# Patient Record
Sex: Male | Born: 1945 | Race: White | Hispanic: No | Marital: Married | Smoking: Former smoker
Health system: Southern US, Community
[De-identification: ages and names within clinical notes are randomized; demographics above are authoritative.]

## PROBLEM LIST (undated history)

## (undated) HISTORY — PX: TONSILLECTOMY: SUR1361

## (undated) HISTORY — PX: ADENOIDECTOMY: SUR15

---

## 2011-09-14 ENCOUNTER — Observation Stay: Payer: Self-pay | Admitting: Internal Medicine

## 2015-11-19 DIAGNOSIS — M436 Torticollis: Secondary | ICD-10-CM | POA: Diagnosis not present

## 2015-12-02 DIAGNOSIS — L570 Actinic keratosis: Secondary | ICD-10-CM | POA: Diagnosis not present

## 2015-12-02 DIAGNOSIS — L578 Other skin changes due to chronic exposure to nonionizing radiation: Secondary | ICD-10-CM | POA: Diagnosis not present

## 2016-01-21 DIAGNOSIS — E291 Testicular hypofunction: Secondary | ICD-10-CM | POA: Diagnosis not present

## 2016-01-21 DIAGNOSIS — Z0000 Encounter for general adult medical examination without abnormal findings: Secondary | ICD-10-CM | POA: Diagnosis not present

## 2016-01-21 DIAGNOSIS — Z1211 Encounter for screening for malignant neoplasm of colon: Secondary | ICD-10-CM | POA: Diagnosis not present

## 2016-01-21 DIAGNOSIS — Z125 Encounter for screening for malignant neoplasm of prostate: Secondary | ICD-10-CM | POA: Diagnosis not present

## 2016-06-07 DIAGNOSIS — M1711 Unilateral primary osteoarthritis, right knee: Secondary | ICD-10-CM | POA: Diagnosis not present

## 2016-07-15 DIAGNOSIS — M1711 Unilateral primary osteoarthritis, right knee: Secondary | ICD-10-CM | POA: Diagnosis not present

## 2016-11-07 DIAGNOSIS — R918 Other nonspecific abnormal finding of lung field: Secondary | ICD-10-CM | POA: Diagnosis not present

## 2016-11-07 DIAGNOSIS — R0789 Other chest pain: Secondary | ICD-10-CM | POA: Diagnosis not present

## 2016-11-07 DIAGNOSIS — R0781 Pleurodynia: Secondary | ICD-10-CM | POA: Diagnosis not present

## 2017-01-24 DIAGNOSIS — E349 Endocrine disorder, unspecified: Secondary | ICD-10-CM | POA: Diagnosis not present

## 2017-01-24 DIAGNOSIS — Z1211 Encounter for screening for malignant neoplasm of colon: Secondary | ICD-10-CM | POA: Diagnosis not present

## 2017-01-24 DIAGNOSIS — Z0000 Encounter for general adult medical examination without abnormal findings: Secondary | ICD-10-CM | POA: Diagnosis not present

## 2017-01-24 DIAGNOSIS — J111 Influenza due to unidentified influenza virus with other respiratory manifestations: Secondary | ICD-10-CM | POA: Diagnosis not present

## 2017-01-24 DIAGNOSIS — Z125 Encounter for screening for malignant neoplasm of prostate: Secondary | ICD-10-CM | POA: Diagnosis not present

## 2017-01-24 DIAGNOSIS — R300 Dysuria: Secondary | ICD-10-CM | POA: Diagnosis not present

## 2017-01-24 DIAGNOSIS — J61 Pneumoconiosis due to asbestos and other mineral fibers: Secondary | ICD-10-CM | POA: Diagnosis not present

## 2017-01-24 DIAGNOSIS — Z77090 Contact with and (suspected) exposure to asbestos: Secondary | ICD-10-CM | POA: Diagnosis not present

## 2017-03-04 ENCOUNTER — Ambulatory Visit: Payer: Self-pay | Admitting: Psychology

## 2017-03-10 DIAGNOSIS — Z1211 Encounter for screening for malignant neoplasm of colon: Secondary | ICD-10-CM | POA: Diagnosis not present

## 2017-03-10 DIAGNOSIS — E349 Endocrine disorder, unspecified: Secondary | ICD-10-CM | POA: Diagnosis not present

## 2017-03-28 ENCOUNTER — Ambulatory Visit: Payer: Self-pay | Admitting: Psychology

## 2017-03-28 DIAGNOSIS — E349 Endocrine disorder, unspecified: Secondary | ICD-10-CM | POA: Diagnosis not present

## 2017-04-21 DIAGNOSIS — E349 Endocrine disorder, unspecified: Secondary | ICD-10-CM | POA: Diagnosis not present

## 2017-04-26 DIAGNOSIS — R234 Changes in skin texture: Secondary | ICD-10-CM | POA: Diagnosis not present

## 2017-04-26 DIAGNOSIS — R203 Hyperesthesia: Secondary | ICD-10-CM | POA: Diagnosis not present

## 2017-04-26 DIAGNOSIS — L0291 Cutaneous abscess, unspecified: Secondary | ICD-10-CM | POA: Diagnosis not present

## 2017-04-26 DIAGNOSIS — L0390 Cellulitis, unspecified: Secondary | ICD-10-CM | POA: Diagnosis not present

## 2017-05-05 DIAGNOSIS — E349 Endocrine disorder, unspecified: Secondary | ICD-10-CM | POA: Diagnosis not present

## 2017-05-11 DIAGNOSIS — R300 Dysuria: Secondary | ICD-10-CM | POA: Diagnosis not present

## 2017-05-30 DIAGNOSIS — L570 Actinic keratosis: Secondary | ICD-10-CM | POA: Diagnosis not present

## 2017-05-30 DIAGNOSIS — L578 Other skin changes due to chronic exposure to nonionizing radiation: Secondary | ICD-10-CM | POA: Diagnosis not present

## 2017-06-23 ENCOUNTER — Encounter: Payer: Self-pay | Admitting: *Deleted

## 2017-06-23 DIAGNOSIS — E349 Endocrine disorder, unspecified: Secondary | ICD-10-CM | POA: Diagnosis not present

## 2017-06-24 ENCOUNTER — Ambulatory Visit: Admission: RE | Admit: 2017-06-24 | Payer: PPO | Source: Ambulatory Visit | Admitting: Unknown Physician Specialty

## 2017-06-24 ENCOUNTER — Encounter: Admission: RE | Payer: Self-pay | Source: Ambulatory Visit

## 2017-06-24 SURGERY — COLONOSCOPY WITH PROPOFOL
Anesthesia: General

## 2017-07-15 DIAGNOSIS — F419 Anxiety disorder, unspecified: Secondary | ICD-10-CM | POA: Diagnosis not present

## 2017-07-15 DIAGNOSIS — R5382 Chronic fatigue, unspecified: Secondary | ICD-10-CM | POA: Diagnosis not present

## 2017-09-15 DIAGNOSIS — E349 Endocrine disorder, unspecified: Secondary | ICD-10-CM | POA: Diagnosis not present

## 2017-09-23 ENCOUNTER — Ambulatory Visit: Admission: RE | Admit: 2017-09-23 | Payer: PPO | Source: Ambulatory Visit | Admitting: Unknown Physician Specialty

## 2017-09-23 ENCOUNTER — Encounter: Admission: RE | Payer: Self-pay | Source: Ambulatory Visit

## 2017-09-23 SURGERY — COLONOSCOPY
Anesthesia: General

## 2017-10-27 DIAGNOSIS — R7989 Other specified abnormal findings of blood chemistry: Secondary | ICD-10-CM | POA: Diagnosis not present

## 2017-11-04 DIAGNOSIS — R7989 Other specified abnormal findings of blood chemistry: Secondary | ICD-10-CM | POA: Diagnosis not present

## 2017-11-24 DIAGNOSIS — R7989 Other specified abnormal findings of blood chemistry: Secondary | ICD-10-CM | POA: Diagnosis not present

## 2017-12-01 DIAGNOSIS — L570 Actinic keratosis: Secondary | ICD-10-CM | POA: Diagnosis not present

## 2017-12-01 DIAGNOSIS — L820 Inflamed seborrheic keratosis: Secondary | ICD-10-CM | POA: Diagnosis not present

## 2017-12-01 DIAGNOSIS — L578 Other skin changes due to chronic exposure to nonionizing radiation: Secondary | ICD-10-CM | POA: Diagnosis not present

## 2017-12-06 DIAGNOSIS — E349 Endocrine disorder, unspecified: Secondary | ICD-10-CM | POA: Diagnosis not present

## 2017-12-08 DIAGNOSIS — R7989 Other specified abnormal findings of blood chemistry: Secondary | ICD-10-CM | POA: Diagnosis not present

## 2017-12-15 DIAGNOSIS — R7989 Other specified abnormal findings of blood chemistry: Secondary | ICD-10-CM | POA: Diagnosis not present

## 2018-01-25 DIAGNOSIS — E349 Endocrine disorder, unspecified: Secondary | ICD-10-CM | POA: Diagnosis not present

## 2018-01-25 DIAGNOSIS — N529 Male erectile dysfunction, unspecified: Secondary | ICD-10-CM | POA: Diagnosis not present

## 2018-01-25 DIAGNOSIS — Z13220 Encounter for screening for lipoid disorders: Secondary | ICD-10-CM | POA: Diagnosis not present

## 2018-01-25 DIAGNOSIS — Z1211 Encounter for screening for malignant neoplasm of colon: Secondary | ICD-10-CM | POA: Diagnosis not present

## 2018-01-25 DIAGNOSIS — Z0000 Encounter for general adult medical examination without abnormal findings: Secondary | ICD-10-CM | POA: Diagnosis not present

## 2018-01-25 DIAGNOSIS — Z125 Encounter for screening for malignant neoplasm of prostate: Secondary | ICD-10-CM | POA: Diagnosis not present

## 2018-01-26 DIAGNOSIS — N529 Male erectile dysfunction, unspecified: Secondary | ICD-10-CM | POA: Diagnosis not present

## 2018-01-26 DIAGNOSIS — Z13220 Encounter for screening for lipoid disorders: Secondary | ICD-10-CM | POA: Diagnosis not present

## 2018-01-26 DIAGNOSIS — E349 Endocrine disorder, unspecified: Secondary | ICD-10-CM | POA: Diagnosis not present

## 2018-01-26 DIAGNOSIS — R972 Elevated prostate specific antigen [PSA]: Secondary | ICD-10-CM | POA: Diagnosis not present

## 2018-03-01 DIAGNOSIS — L570 Actinic keratosis: Secondary | ICD-10-CM | POA: Diagnosis not present

## 2018-03-01 DIAGNOSIS — L578 Other skin changes due to chronic exposure to nonionizing radiation: Secondary | ICD-10-CM | POA: Diagnosis not present

## 2018-03-01 DIAGNOSIS — D2230 Melanocytic nevi of unspecified part of face: Secondary | ICD-10-CM | POA: Diagnosis not present

## 2018-03-01 DIAGNOSIS — L719 Rosacea, unspecified: Secondary | ICD-10-CM | POA: Diagnosis not present

## 2018-03-08 DIAGNOSIS — M545 Low back pain: Secondary | ICD-10-CM | POA: Diagnosis not present

## 2018-03-08 DIAGNOSIS — M7061 Trochanteric bursitis, right hip: Secondary | ICD-10-CM | POA: Diagnosis not present

## 2018-04-26 DIAGNOSIS — F411 Generalized anxiety disorder: Secondary | ICD-10-CM | POA: Diagnosis not present

## 2018-04-26 DIAGNOSIS — M898X1 Other specified disorders of bone, shoulder: Secondary | ICD-10-CM | POA: Diagnosis not present

## 2018-04-26 DIAGNOSIS — M25551 Pain in right hip: Secondary | ICD-10-CM | POA: Diagnosis not present

## 2018-05-13 DIAGNOSIS — H524 Presbyopia: Secondary | ICD-10-CM | POA: Diagnosis not present

## 2018-05-16 DIAGNOSIS — S80861A Insect bite (nonvenomous), right lower leg, initial encounter: Secondary | ICD-10-CM | POA: Diagnosis not present

## 2018-08-02 DIAGNOSIS — E349 Endocrine disorder, unspecified: Secondary | ICD-10-CM | POA: Diagnosis not present

## 2018-08-23 DIAGNOSIS — M542 Cervicalgia: Secondary | ICD-10-CM | POA: Diagnosis not present

## 2018-08-23 DIAGNOSIS — M62838 Other muscle spasm: Secondary | ICD-10-CM | POA: Diagnosis not present

## 2018-08-28 DIAGNOSIS — M7541 Impingement syndrome of right shoulder: Secondary | ICD-10-CM | POA: Diagnosis not present

## 2018-08-28 DIAGNOSIS — M5412 Radiculopathy, cervical region: Secondary | ICD-10-CM | POA: Diagnosis not present

## 2018-09-01 DIAGNOSIS — M5412 Radiculopathy, cervical region: Secondary | ICD-10-CM | POA: Diagnosis not present

## 2018-09-05 DIAGNOSIS — M5412 Radiculopathy, cervical region: Secondary | ICD-10-CM | POA: Diagnosis not present

## 2018-09-08 DIAGNOSIS — M5412 Radiculopathy, cervical region: Secondary | ICD-10-CM | POA: Diagnosis not present

## 2018-09-26 DIAGNOSIS — M5412 Radiculopathy, cervical region: Secondary | ICD-10-CM | POA: Diagnosis not present

## 2018-09-26 DIAGNOSIS — M5030 Other cervical disc degeneration, unspecified cervical region: Secondary | ICD-10-CM | POA: Diagnosis not present

## 2018-10-25 DIAGNOSIS — E349 Endocrine disorder, unspecified: Secondary | ICD-10-CM | POA: Diagnosis not present

## 2019-02-02 DIAGNOSIS — Z1211 Encounter for screening for malignant neoplasm of colon: Secondary | ICD-10-CM | POA: Diagnosis not present

## 2019-02-02 DIAGNOSIS — Z131 Encounter for screening for diabetes mellitus: Secondary | ICD-10-CM | POA: Diagnosis not present

## 2019-02-02 DIAGNOSIS — Z0000 Encounter for general adult medical examination without abnormal findings: Secondary | ICD-10-CM | POA: Diagnosis not present

## 2019-02-02 DIAGNOSIS — Z125 Encounter for screening for malignant neoplasm of prostate: Secondary | ICD-10-CM | POA: Diagnosis not present

## 2019-02-02 DIAGNOSIS — E349 Endocrine disorder, unspecified: Secondary | ICD-10-CM | POA: Diagnosis not present

## 2019-02-06 DIAGNOSIS — E349 Endocrine disorder, unspecified: Secondary | ICD-10-CM | POA: Diagnosis not present

## 2019-02-06 DIAGNOSIS — Z131 Encounter for screening for diabetes mellitus: Secondary | ICD-10-CM | POA: Diagnosis not present

## 2019-02-06 DIAGNOSIS — Z125 Encounter for screening for malignant neoplasm of prostate: Secondary | ICD-10-CM | POA: Diagnosis not present

## 2019-05-04 ENCOUNTER — Other Ambulatory Visit: Payer: Self-pay | Admitting: Specialist

## 2019-05-04 DIAGNOSIS — R0609 Other forms of dyspnea: Secondary | ICD-10-CM

## 2019-05-04 DIAGNOSIS — R0600 Dyspnea, unspecified: Secondary | ICD-10-CM | POA: Diagnosis not present

## 2019-05-10 ENCOUNTER — Other Ambulatory Visit: Payer: Self-pay

## 2019-05-10 ENCOUNTER — Ambulatory Visit
Admission: RE | Admit: 2019-05-10 | Discharge: 2019-05-10 | Disposition: A | Discharge status: Home / Self Care | Payer: PPO | Source: Ambulatory Visit | Attending: Specialist | Admitting: Specialist

## 2019-05-10 DIAGNOSIS — R0609 Other forms of dyspnea: Secondary | ICD-10-CM | POA: Insufficient documentation

## 2019-05-24 DIAGNOSIS — J449 Chronic obstructive pulmonary disease, unspecified: Secondary | ICD-10-CM | POA: Diagnosis not present

## 2019-05-24 DIAGNOSIS — R0600 Dyspnea, unspecified: Secondary | ICD-10-CM | POA: Diagnosis not present

## 2019-06-06 DIAGNOSIS — H524 Presbyopia: Secondary | ICD-10-CM | POA: Diagnosis not present

## 2019-07-04 DIAGNOSIS — H16141 Punctate keratitis, right eye: Secondary | ICD-10-CM | POA: Diagnosis not present

## 2019-07-04 DIAGNOSIS — H2513 Age-related nuclear cataract, bilateral: Secondary | ICD-10-CM | POA: Diagnosis not present

## 2019-07-04 DIAGNOSIS — H1011 Acute atopic conjunctivitis, right eye: Secondary | ICD-10-CM | POA: Diagnosis not present

## 2019-07-19 ENCOUNTER — Other Ambulatory Visit: Payer: Self-pay

## 2019-07-19 DIAGNOSIS — R6889 Other general symptoms and signs: Secondary | ICD-10-CM | POA: Diagnosis not present

## 2019-07-19 DIAGNOSIS — Z20822 Contact with and (suspected) exposure to covid-19: Secondary | ICD-10-CM

## 2019-07-20 LAB — NOVEL CORONAVIRUS, NAA: SARS-CoV-2, NAA: NOT DETECTED

## 2019-08-10 DIAGNOSIS — R079 Chest pain, unspecified: Secondary | ICD-10-CM | POA: Diagnosis not present

## 2019-08-10 DIAGNOSIS — J439 Emphysema, unspecified: Secondary | ICD-10-CM | POA: Diagnosis not present

## 2019-10-08 ENCOUNTER — Other Ambulatory Visit: Payer: Self-pay | Admitting: Specialist

## 2019-10-08 DIAGNOSIS — R0609 Other forms of dyspnea: Secondary | ICD-10-CM

## 2019-10-22 ENCOUNTER — Other Ambulatory Visit: Payer: Self-pay

## 2019-10-22 ENCOUNTER — Ambulatory Visit
Admission: RE | Admit: 2019-10-22 | Discharge: 2019-10-22 | Disposition: A | Discharge status: Home / Self Care | Payer: PPO | Source: Ambulatory Visit | Attending: Specialist | Admitting: Specialist

## 2019-10-22 DIAGNOSIS — R0602 Shortness of breath: Secondary | ICD-10-CM | POA: Diagnosis not present

## 2019-10-22 DIAGNOSIS — R0600 Dyspnea, unspecified: Secondary | ICD-10-CM | POA: Diagnosis not present

## 2019-10-22 DIAGNOSIS — R0609 Other forms of dyspnea: Secondary | ICD-10-CM

## 2019-10-24 ENCOUNTER — Other Ambulatory Visit: Payer: Self-pay | Admitting: Specialist

## 2019-10-24 DIAGNOSIS — R918 Other nonspecific abnormal finding of lung field: Secondary | ICD-10-CM

## 2020-01-24 ENCOUNTER — Ambulatory Visit: Payer: PPO | Attending: Internal Medicine

## 2020-01-24 DIAGNOSIS — Z20822 Contact with and (suspected) exposure to covid-19: Secondary | ICD-10-CM | POA: Diagnosis not present

## 2020-01-25 LAB — NOVEL CORONAVIRUS, NAA: SARS-CoV-2, NAA: DETECTED — AB

## 2020-01-26 ENCOUNTER — Other Ambulatory Visit: Payer: Self-pay | Admitting: Adult Health

## 2020-01-26 ENCOUNTER — Telehealth: Payer: Self-pay | Admitting: Adult Health

## 2020-01-26 MED ORDER — BAMLANIVIMAB 700MG/20ML INJECTION
700.0000 mg | Freq: Once | INTRAMUSCULAR | Status: AC
  Administered 2020-01-27: 700 mg via INTRAVENOUS
  Filled 2020-01-26: qty 700

## 2020-01-26 NOTE — Progress Notes (Signed)
  I connected by phone with Anthony Welch. on 01/26/2020 at 9:49 AM to discuss the potential use of an new treatment for mild to moderate COVID-19 viral infection in non-hospitalized patients.  This patient is a 74 y.o. male that meets the FDA criteria for Emergency Use Authorization of bamlanivimab or casirivimab\imdevimab.  Has a (+) direct SARS-CoV-2 viral test result  Has mild or moderate COVID-19   Is ? 72 years of age and weighs ? 40 kg  Is NOT hospitalized due to COVID-19  Is NOT requiring oxygen therapy or requiring an increase in baseline oxygen flow rate due to COVID-19  Is within 10 days of symptom onset  Has at least one of the high risk factor(s) for progression to severe COVID-19 and/or hospitalization as defined in EUA.  Specific high risk criteria : >/= 72 yo   I have spoken and communicated the following to the patient or parent/caregiver:  1. FDA has authorized the emergency use of bamlanivimab and casirivimab\imdevimab for the treatment of mild to moderate COVID-19 in adults and pediatric patients with positive results of direct SARS-CoV-2 viral testing who are 60 years of age and older weighing at least 40 kg, and who are at high risk for progressing to severe COVID-19 and/or hospitalization.  2. The significant known and potential risks and benefits of bamlanivimab and casirivimab\imdevimab, and the extent to which such potential risks and benefits are unknown.  3. Information on available alternative treatments and the risks and benefits of those alternatives, including clinical trials.  4. Patients treated with bamlanivimab and casirivimab\imdevimab should continue to self-isolate and use infection control measures (e.g., wear mask, isolate, social distance, avoid sharing personal items, clean and disinfect "high touch" surfaces, and frequent handwashing) according to CDC guidelines.   5. The patient or parent/caregiver has the option to accept or refuse  bamlanivimab or casirivimab\imdevimab .  After reviewing this information with the patient, The patient agreed to proceed with receiving the bamlanimivab infusion and will be provided a copy of the Fact sheet prior to receiving the infusion.   Scheduled for 01/27/20 at 1030 .  [name] 01/26/2020 9:49 AM

## 2020-01-26 NOTE — Telephone Encounter (Signed)
Called to discuss with patient his positive COVID-19 results and check to see how he is doing.  Patient is at high risk for potential complications of [id number] and to discuss possible treatment option with monoclonal antibody infusion.  Voicemail was left for patient to return call at infusion center.  Keiondra Brookover NP-C

## 2020-01-27 ENCOUNTER — Ambulatory Visit (HOSPITAL_COMMUNITY)
Admission: RE | Admit: 2020-01-27 | Discharge: 2020-01-27 | Disposition: A | Discharge status: Home / Self Care | Payer: Medicare Other | Source: Ambulatory Visit | Attending: Pulmonary Disease | Admitting: Pulmonary Disease

## 2020-01-27 DIAGNOSIS — U071 COVID-19: Secondary | ICD-10-CM | POA: Diagnosis not present

## 2020-01-27 DIAGNOSIS — Z23 Encounter for immunization: Secondary | ICD-10-CM | POA: Insufficient documentation

## 2020-01-27 MED ORDER — DIPHENHYDRAMINE HCL 50 MG/ML IJ SOLN: 50.0000 mg | Freq: Once | INTRAMUSCULAR | Status: DC | PRN

## 2020-01-27 MED ORDER — FAMOTIDINE IN NACL 20-0.9 MG/50ML-% IV SOLN: 20.0000 mg | Freq: Once | INTRAVENOUS | Status: DC | PRN

## 2020-01-27 MED ORDER — EPINEPHRINE 0.3 MG/0.3ML IJ SOAJ: 0.3000 mg | Freq: Once | INTRAMUSCULAR | Status: DC | PRN

## 2020-01-27 MED ORDER — ALBUTEROL SULFATE HFA 108 (90 BASE) MCG/ACT IN AERS: 2.0000 | INHALATION_SPRAY | Freq: Once | RESPIRATORY_TRACT | Status: DC | PRN

## 2020-01-27 MED ORDER — SODIUM CHLORIDE 0.9 % IV SOLN
INTRAVENOUS | Status: DC | PRN
  Administered 2020-01-27: 250 mL via INTRAVENOUS

## 2020-01-27 MED ORDER — METHYLPREDNISOLONE SODIUM SUCC 125 MG IJ SOLR: 125.0000 mg | Freq: Once | INTRAMUSCULAR | Status: DC | PRN

## 2020-01-27 NOTE — Progress Notes (Signed)
  Diagnosis: COVID-19  Physician: Dr. Joya Gaskins  Procedure: [facility] Med: bamlanivimab infusion - Provided patient with bamlanimivab fact sheet for patients, parents and caregivers prior to infusion.  Complications: No immediate complications noted.  Discharge: Discharged home   Acquanetta Chain 01/27/2020

## 2020-01-27 NOTE — Discharge Instructions (Signed)

## 2020-02-28 DIAGNOSIS — Z8616 Personal history of COVID-19: Secondary | ICD-10-CM | POA: Diagnosis not present

## 2020-02-28 DIAGNOSIS — Z0000 Encounter for general adult medical examination without abnormal findings: Secondary | ICD-10-CM | POA: Diagnosis not present

## 2020-02-28 DIAGNOSIS — Z125 Encounter for screening for malignant neoplasm of prostate: Secondary | ICD-10-CM | POA: Diagnosis not present

## 2020-02-28 DIAGNOSIS — E349 Endocrine disorder, unspecified: Secondary | ICD-10-CM | POA: Diagnosis not present

## 2020-02-28 DIAGNOSIS — R0600 Dyspnea, unspecified: Secondary | ICD-10-CM | POA: Diagnosis not present

## 2020-03-26 DIAGNOSIS — Z125 Encounter for screening for malignant neoplasm of prostate: Secondary | ICD-10-CM | POA: Diagnosis not present

## 2020-03-26 DIAGNOSIS — E349 Endocrine disorder, unspecified: Secondary | ICD-10-CM | POA: Diagnosis not present

## 2020-03-26 DIAGNOSIS — R0600 Dyspnea, unspecified: Secondary | ICD-10-CM | POA: Diagnosis not present

## 2020-04-11 IMAGING — RF SNIFF TEST
3 series · 12 of 12 positions shown · non-contrast
Comparison: None.

CLINICAL DATA: Dyspnea on exertion

EXAM:
CHEST FLUOROSCOPY
TECHNIQUE: Real-time fluoroscopic evaluation of the chest was performed.
FLUOROSCOPY TIME:  Fluoroscopy Time:  0.5 minute
Radiation Exposure Index (if provided by the fluoroscopic device):
3.4 mGy
Number of Acquired Spot Images: 0

[Series 1: cp_chest · 0.25mm/px · 4 of 60 frames shown (1 of 3)]
[frame 10/60]
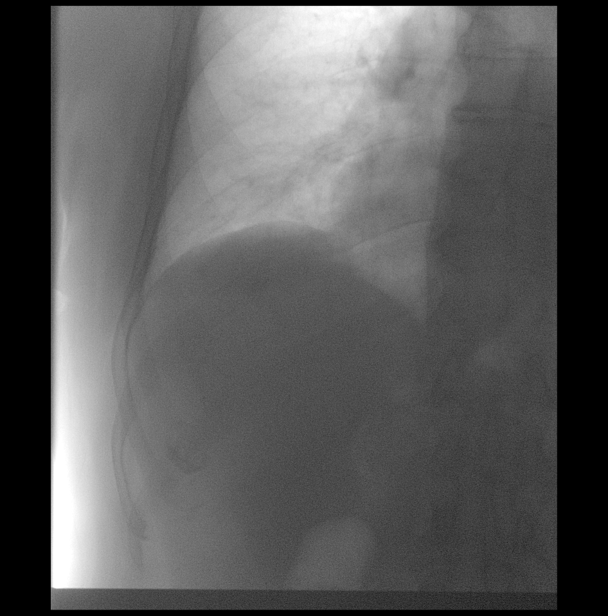
[frame 31/60]
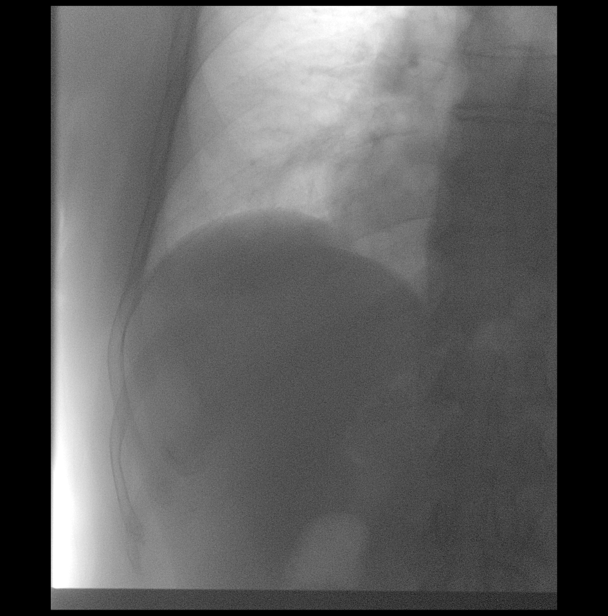
[frame 52/60]
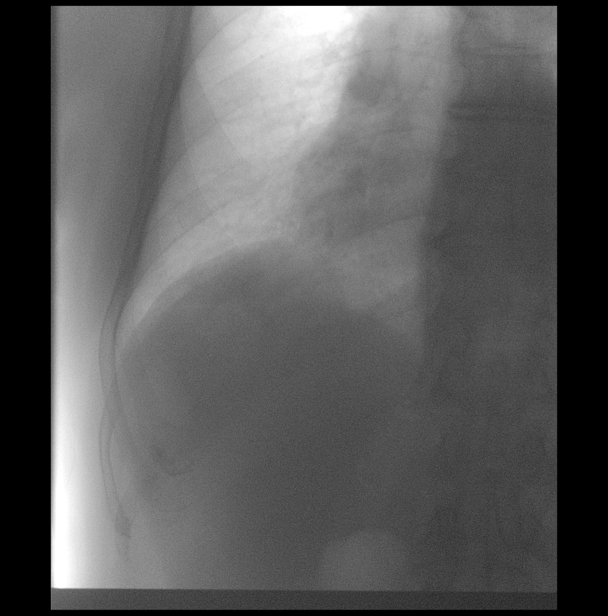
[frame 60/60]
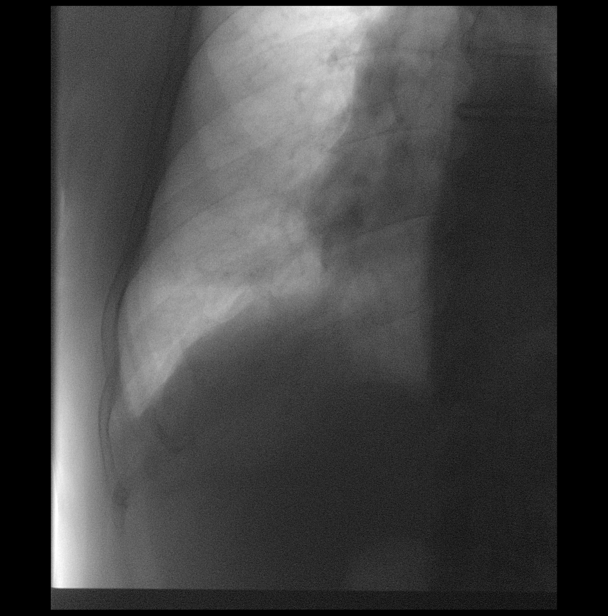

[Series 2: cp_chest · 0.25mm/px · 4 of 74 frames shown (2 of 3)]
[frame 12/74]
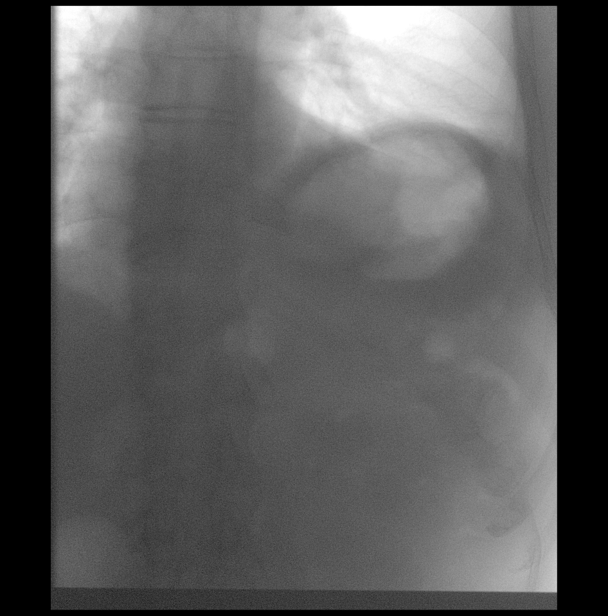
[frame 30/74]
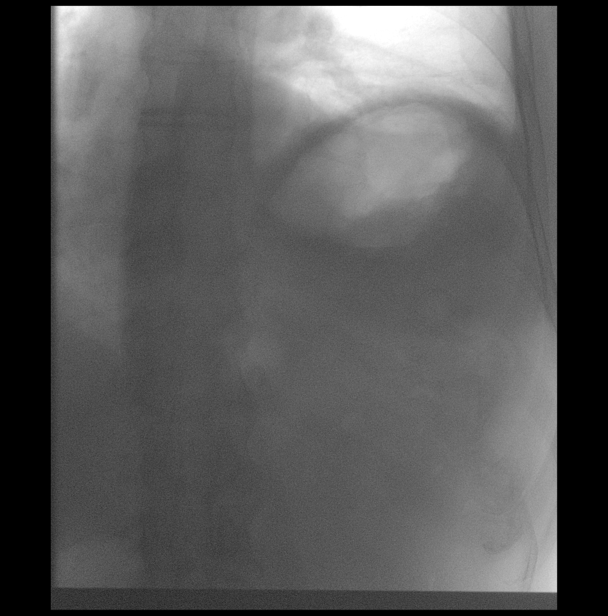
[frame 38/74]
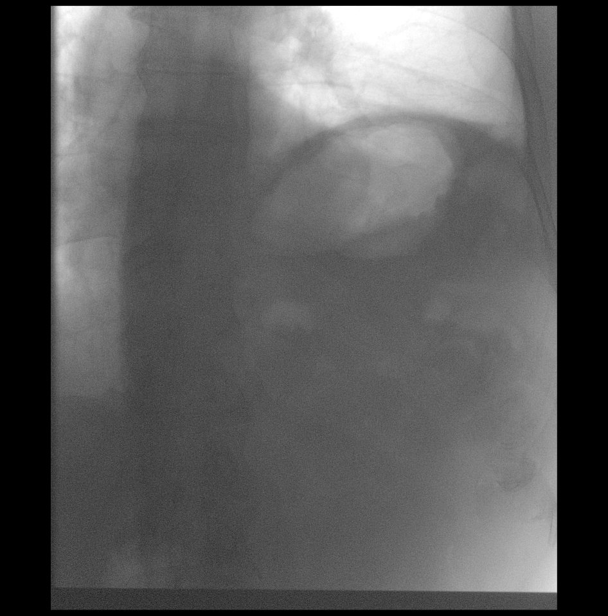
[frame 63/74]
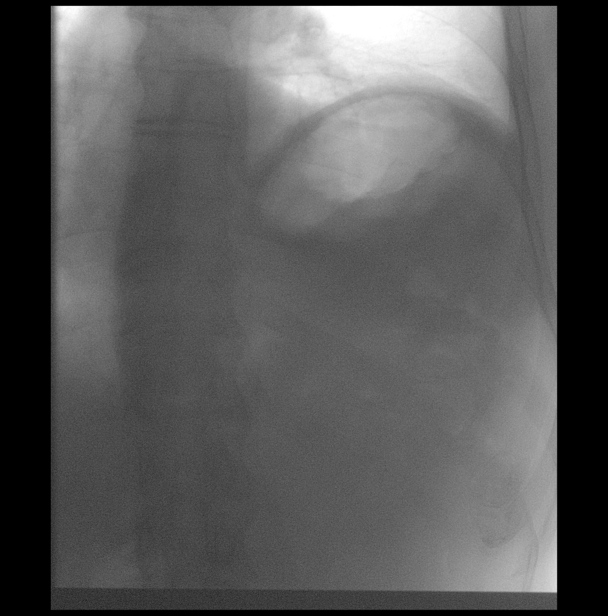

[Series 3: cp_chest · 0.25mm/px · 4 of 58 frames shown (3 of 3)]
[frame 9/58]
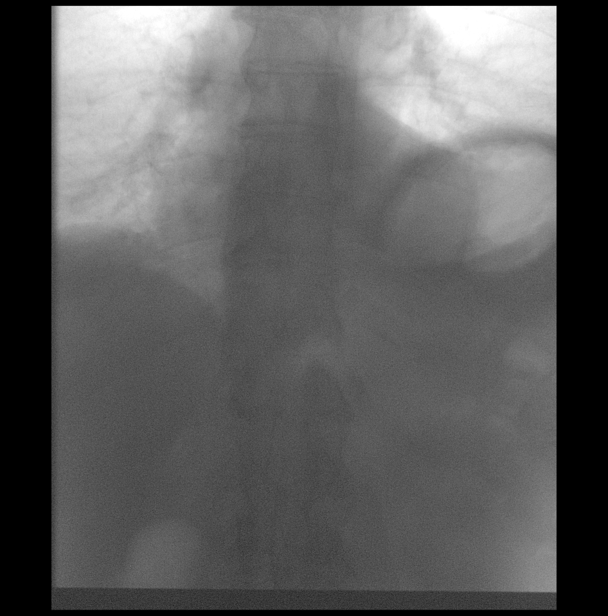
[frame 30/58]
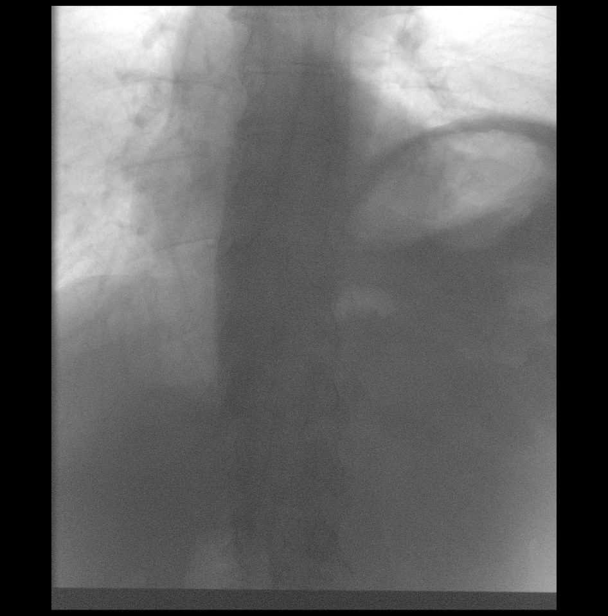
[frame 46/58]
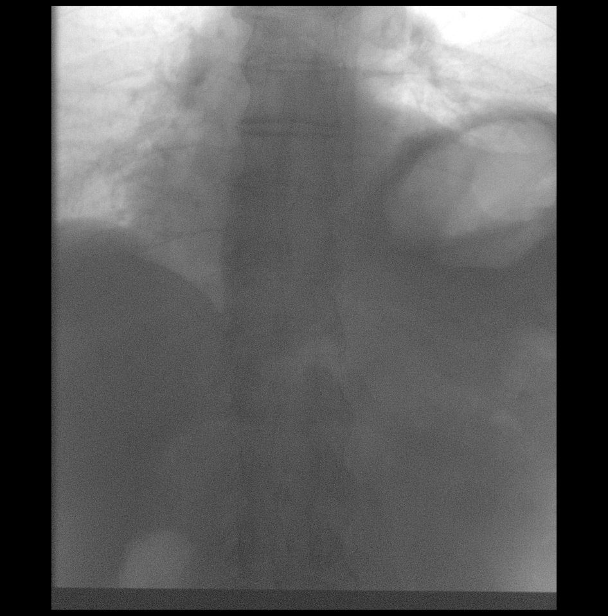
[frame 50/58]
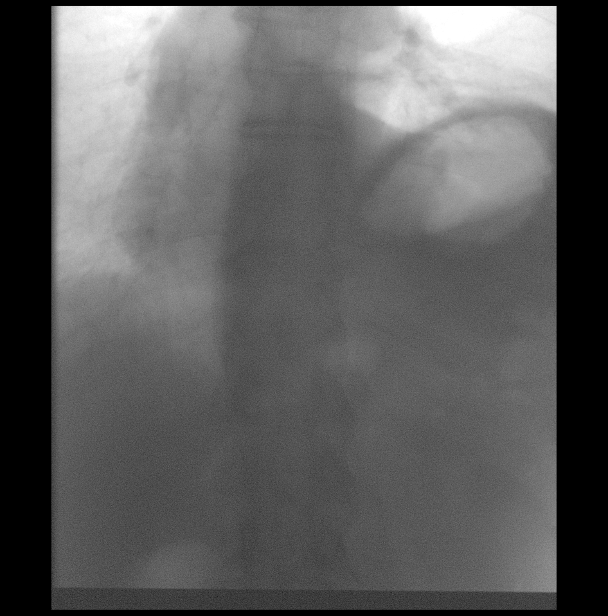

[12 of 12 positions shown; findings below may reference images not displayed]

FINDINGS: Real-time fluoroscopy was utilized for evaluation of diaphragmatic
motion.

Normal right diaphragmatic motion during inspiration, expiration and
sniffing.

No significant left diaphragmatic motion during inspiration,
expiration and sniffing. Elevation of the left diaphragm.
IMPRESSION: 1. Elevation of the left diaphragm without significant diaphragmatic
motion during inspiration and expiration most consistent with
paralysis.
2. Normal right diaphragmatic motion.

## 2020-04-18 ENCOUNTER — Other Ambulatory Visit: Payer: Self-pay

## 2020-04-18 ENCOUNTER — Ambulatory Visit
Admission: RE | Admit: 2020-04-18 | Discharge: 2020-04-18 | Disposition: A | Discharge status: Home / Self Care | Payer: PPO | Source: Ambulatory Visit | Attending: Specialist | Admitting: Specialist

## 2020-04-18 DIAGNOSIS — R918 Other nonspecific abnormal finding of lung field: Secondary | ICD-10-CM | POA: Insufficient documentation

## 2020-04-22 DIAGNOSIS — J449 Chronic obstructive pulmonary disease, unspecified: Secondary | ICD-10-CM | POA: Diagnosis not present

## 2020-04-22 DIAGNOSIS — R918 Other nonspecific abnormal finding of lung field: Secondary | ICD-10-CM | POA: Diagnosis not present

## 2020-04-22 DIAGNOSIS — R0600 Dyspnea, unspecified: Secondary | ICD-10-CM | POA: Diagnosis not present

## 2020-04-24 ENCOUNTER — Ambulatory Visit: Payer: PPO | Admitting: Dermatology

## 2020-04-24 ENCOUNTER — Other Ambulatory Visit: Payer: Self-pay

## 2020-04-24 DIAGNOSIS — L918 Other hypertrophic disorders of the skin: Secondary | ICD-10-CM | POA: Diagnosis not present

## 2020-04-24 DIAGNOSIS — D229 Melanocytic nevi, unspecified: Secondary | ICD-10-CM | POA: Diagnosis not present

## 2020-04-24 DIAGNOSIS — D1801 Hemangioma of skin and subcutaneous tissue: Secondary | ICD-10-CM | POA: Diagnosis not present

## 2020-04-24 DIAGNOSIS — L821 Other seborrheic keratosis: Secondary | ICD-10-CM | POA: Diagnosis not present

## 2020-04-24 DIAGNOSIS — L219 Seborrheic dermatitis, unspecified: Secondary | ICD-10-CM

## 2020-04-24 DIAGNOSIS — L578 Other skin changes due to chronic exposure to nonionizing radiation: Secondary | ICD-10-CM | POA: Diagnosis not present

## 2020-04-24 DIAGNOSIS — L570 Actinic keratosis: Secondary | ICD-10-CM

## 2020-04-24 MED ORDER — KETOCONAZOLE 2 % EX CREA
1.0000 | TOPICAL_CREAM | Freq: Every day | CUTANEOUS | 11 refills | Status: AC
Start: 2020-04-24 — End: 2020-06-05

## 2020-04-24 MED ORDER — HYDROCORTISONE 2.5 % EX CREA: TOPICAL_CREAM | Freq: Every day | CUTANEOUS | 11 refills | Status: DC

## 2020-04-24 NOTE — Progress Notes (Signed)
   Follow-Up Visit   Subjective  Anthony Welch. is a 74 y.o. male who presents for the following: Follow-up.  Patient here today for AK follow up of the scalp. He has noticed a new spot on the back of scalp, occasionally bleeds a little that may be a result of scratching. 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  The patient presents for Upper Body Skin Exam (UBSE) for skin cancer screening and mole check.  The following portions of the chart were reviewed this encounter and updated as appropriate:  Tobacco  Allergies  Meds  Problems  Med Hx  Surg Hx  Fam Hx      Review of Systems:  No other skin or systemic complaints except as noted in HPI or Assessment and Plan.  Objective  Well appearing patient in no apparent distress; mood and affect are within normal limits.  All skin waist up examined.  Objective  Chest - Medial [facility]): Pink patches with greasy scale.   Objective  Scalp, face x 16 (16): Erythematous thin papules/macules with gritty scale.    Assessment & Plan    Seborrheic dermatitis Chest - Medial [facility]) Advised patient that condition is treatable but not curable and goal for treatment is control. Start HC 2.5% three times a week at bedtime alternating with ketoconazole. Start ketoconazole 2% cream three times a week at bedtime.  Ordered Medications: ketoconazole (NIZORAL) 2 % cream hydrocortisone 2.5 % cream  AK (actinic keratosis) (16) Scalp, face x 16  Destruction of lesion - Scalp, face x 16 Complexity: simple   Destruction method: cryotherapy   Informed consent: discussed and consent obtained   Timeout:  patient name, date of birth, surgical site, and procedure verified Lesion destroyed using liquid nitrogen: Yes   Region frozen until ice ball extended beyond lesion: Yes   Outcome: patient tolerated procedure well with no complications   Post-procedure  details: wound care instructions given    Return in about 1 year (around 04/24/2021) for UBSE.  Actinic Damage - diffuse scaly erythematous macules with underlying dyspigmentation - Recommend daily broad spectrum sunscreen SPF 30+ to sun-exposed areas, reapply every 2 hours as needed.  - Call for new or changing lesions.  Seborrheic Keratoses - Stuck-on, waxy, tan-Petkus papules and plaques  - Discussed benign etiology and prognosis. - Observe - Call for any changes  Melanocytic Nevi - Tan-Ales and/or pink-flesh-colored symmetric macules and papules - Benign appearing on exam today - Observation - Call clinic for new or changing moles - Recommend daily use of broad spectrum spf 30+ sunscreen to sun-exposed areas.   Hemangiomas - Red papules - Discussed benign nature - Observe - Call for any changes  Acrochordons (Skin Tags) - Fleshy, skin-colored pedunculated papules - Benign appearing.  - Observe. - If desired, they can be removed with an in office procedure that is not covered by insurance. - Please call the clinic if you notice any new or changing lesions.  Return in about 1 year (around 04/24/2021) for UBSE.  Graciella Belton, RMA, am acting as scribe for Sarina Ser, MD . Documentation: I have reviewed the above documentation for accuracy and completeness, and I agree with the above.  Sarina Ser, MD

## 2020-04-24 NOTE — Patient Instructions (Addendum)
Recommend daily broad spectrum sunscreen SPF 30+ to sun-exposed areas, reapply every 2 hours as needed. Call for new or changing lesions.  Cryotherapy Aftercare  . Wash gently with soap and water everyday.   Marland Kitchen Apply Vaseline and Band-Aid daily until healed.   Seborrheic Dermatitis  What is seborrheic dermatitis? Seborrheic (say: seb-oh-ree-ick) dermatitis is a disease that causes flaking of the skin.  It usually affects the scalp.  In teenagers and adults, it is commonly called "dandruff".  In infants, it is referred to as "cradle cap".  Dandruff often appears as scaling on the scalp with or without redness.  On other parts of the body, seborrheic dermatitis tends to produce both redness and scaling.  Other common locations of seborrheic dermatitis include the central face, eyebrows, chest, and the creases of the arms, legs, and groin.  It often causes the skin to look a little greasy, scaly, or flaky. Seborrheic dermatitis can occur at any age.  It often comes and goes and may to be seasonally related, especially in the Northern climates.  What causes seborrheic dermatitis? The exact cause is not known, though yeast of the Malassezia species may be involved.  This organism is normally present on the skin in small numbers, but sometimes its numbers increase, especially in oily skin.  Treatments that reduce the yeast tend to improve seborrheic dermatitis.  How is seborrheic dermatitis treated? The treatment of seborrheic dermatitis depends on its location on the body and the person's age. Seborrheic dermatitis of the scalp (dandruff) in adults and teenagers is usually treated with a medicated shampoo.  Here is a list of the medications that help, and the over-counter shampoos that contain them:  Salicylic acid (Neutrogena T/Sal, Sebulex, Scalpicin, Denorex Extra Strength)  Zinc pyrithione (Head & Shoulders white bottle, Denorex Daily, DHS Zinc, Pantene Pro-V Pyrithione Zinc)  Selenium sulfide  (Head & Shoulders blue bottle, Selsun Blue, Exsel Lotion Shampoo, Glo-Sel)  Yahoo tar (Neutrogena T/Gal, Pentrax, Zetar, Tegrin, Viacom, Therapeutic Denorex)  Ketoconazole (Nizoral)  If you have dandruff, you might start by using one of these shampoos every day until your dandruff is controlled and then keep using it at least twice a week.  Often times your doctor will recommend a rotation of several different medicated shampoos as some will experience a plateau in the effectiveness of any one shampoo.   When you use a dandruff shampoo, rub the shampoo into your wet hair and massage into scalp thoroughly.  Let it stay on your hair and scalp for 5 minutes before rinsing.  If you have involvement in the eyebrows or face, you can lather those areas with the medicated shampoo as well, or use a medicated soap (ZNP-bar, Polytar Soap, SAStid, or sulfur soap).    If the wash or shampoo alone does not help, your doctor might want you to use a prescription medication once or twice a day.  Leave-in medications for the scalp are best applied by massaging into the scalp immediately after towel drying your hair, but may be applied even if you have not washed your hair.  Seborrheic dermatitis in infants usually clears up by age 5 -69 months.  It may develop in the diaper area where it might be confused with diaper rash.  For milder cases you can try gently brushing out scales with a soft brush.  This is best done immediately after washing with a non-medicated baby shampoo Wynetta Emery and Royce Macadamia, etc.).  Your doctor may recommend a medicated shampoo or  a prescription topical medication.

## 2020-04-28 ENCOUNTER — Encounter: Payer: Self-pay | Admitting: Dermatology

## 2020-05-19 ENCOUNTER — Other Ambulatory Visit: Payer: Self-pay

## 2020-05-19 MED ORDER — VALACYCLOVIR HCL 1 G PO TABS: 1000.0000 mg | ORAL_TABLET | ORAL | 0 refills | Status: AC

## 2020-05-19 NOTE — Telephone Encounter (Signed)
Refill of Valtrex approved.

## 2020-06-11 DIAGNOSIS — J449 Chronic obstructive pulmonary disease, unspecified: Secondary | ICD-10-CM | POA: Diagnosis not present

## 2020-06-11 DIAGNOSIS — Z01812 Encounter for preprocedural laboratory examination: Secondary | ICD-10-CM | POA: Diagnosis not present

## 2020-06-11 DIAGNOSIS — Z1211 Encounter for screening for malignant neoplasm of colon: Secondary | ICD-10-CM | POA: Diagnosis not present

## 2020-06-11 DIAGNOSIS — R143 Flatulence: Secondary | ICD-10-CM | POA: Diagnosis not present

## 2020-07-03 DIAGNOSIS — H6123 Impacted cerumen, bilateral: Secondary | ICD-10-CM | POA: Diagnosis not present

## 2020-07-03 DIAGNOSIS — H902 Conductive hearing loss, unspecified: Secondary | ICD-10-CM | POA: Diagnosis not present

## 2020-07-24 DIAGNOSIS — Z20828 Contact with and (suspected) exposure to other viral communicable diseases: Secondary | ICD-10-CM | POA: Diagnosis not present

## 2020-08-25 DIAGNOSIS — R1032 Left lower quadrant pain: Secondary | ICD-10-CM | POA: Diagnosis not present

## 2020-09-23 IMAGING — CT CT CHEST W/O CM
2 of 4 series · 15 of 36 positions shown, 18 images · non-contrast
Comparison: 09/14/2011

CLINICAL DATA: Shortness of breath on exertion.

EXAM:
CT CHEST WITHOUT CONTRAST
TECHNIQUE: Multidetector CT imaging of the chest was performed following the
standard protocol without IV contrast.

[Series 2: chest 2.00 · axial · 0.78mm/px · z∈[-1186,-914]mm · 12 of 162 slices shown, 15 images]
[im 13/162  mediastinal]
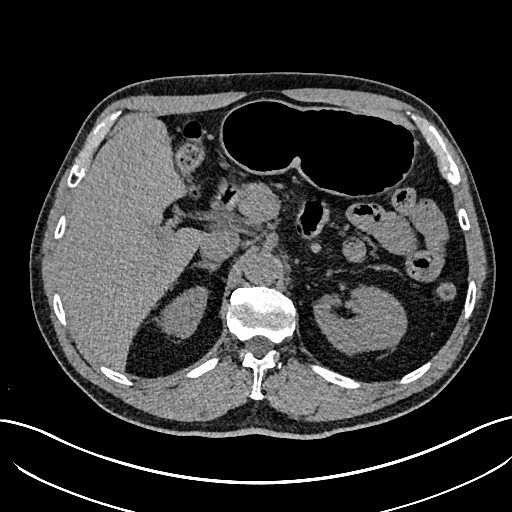
[im 13/162  lung]
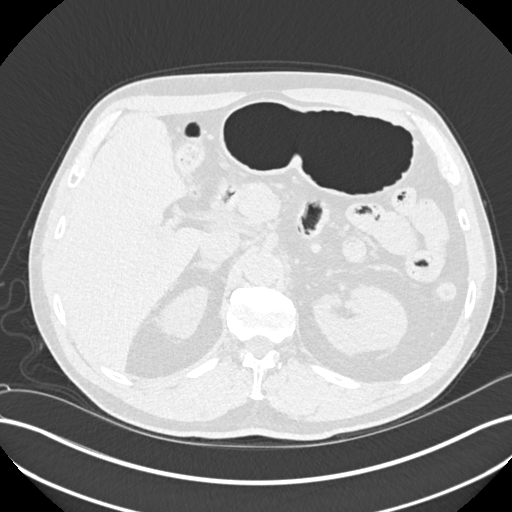
[im 25/162  lung]
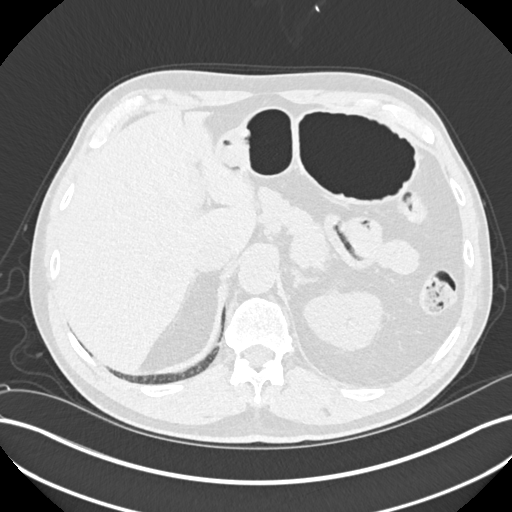
[im 38/162  lung]
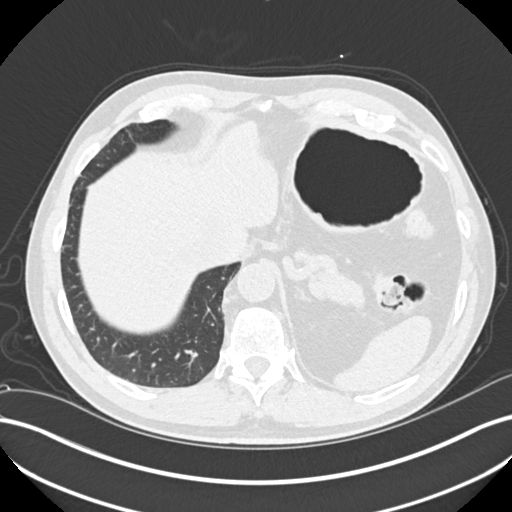
[im 50/162  lung]
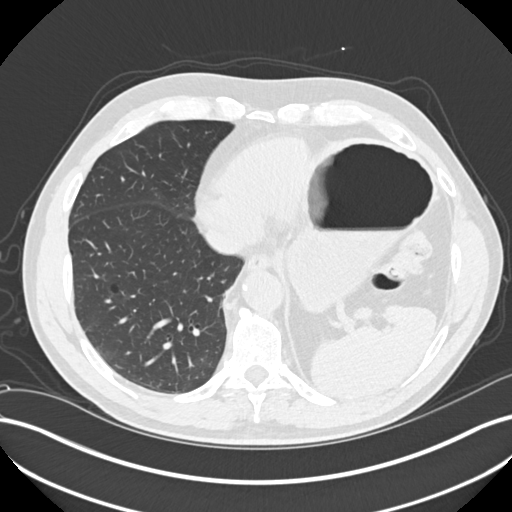
[im 62/162  mediastinal]
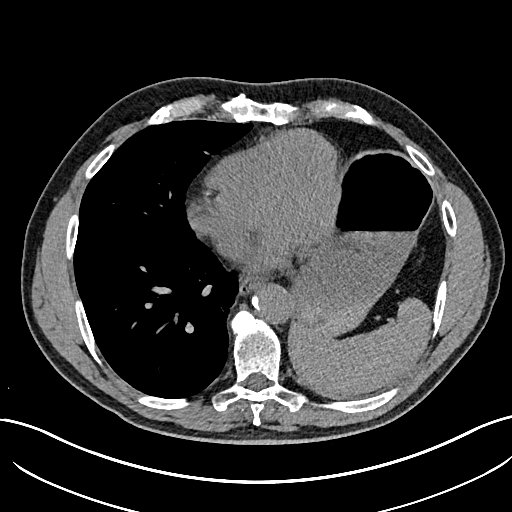
[im 62/162  lung]
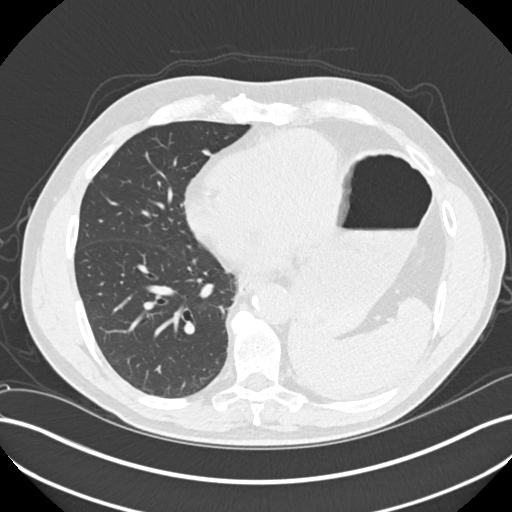
[im 75/162  lung]
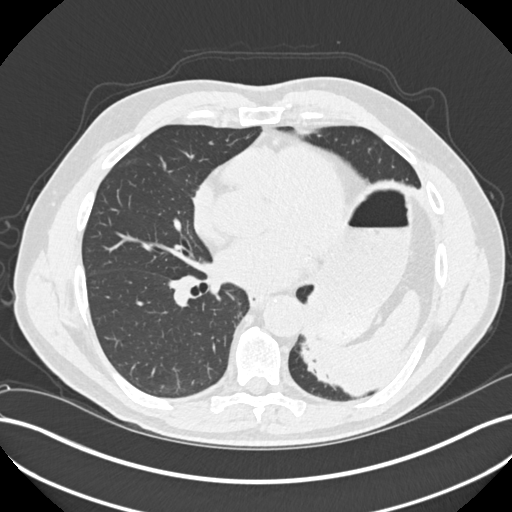
[im 87/162  lung]
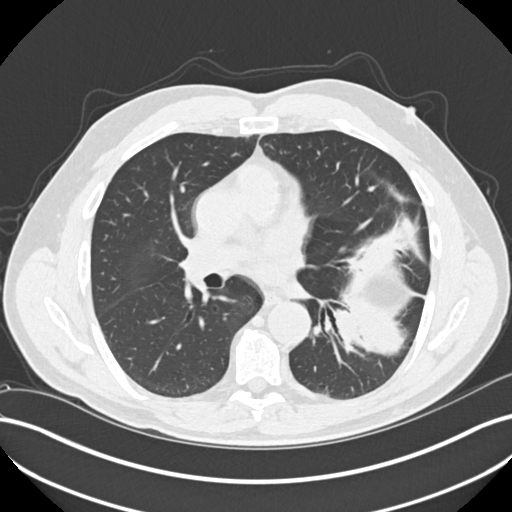
[im 100/162  lung]
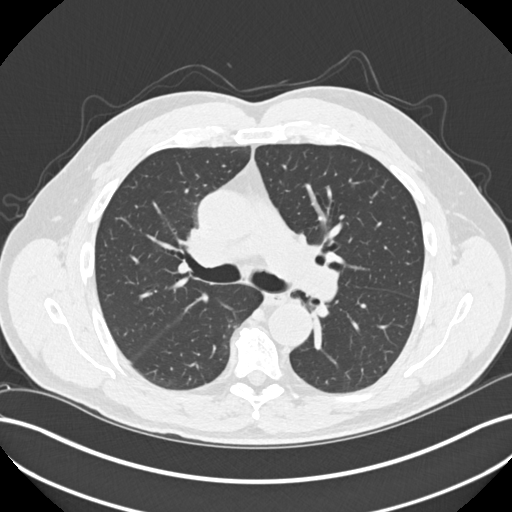
[im 112/162  mediastinal]
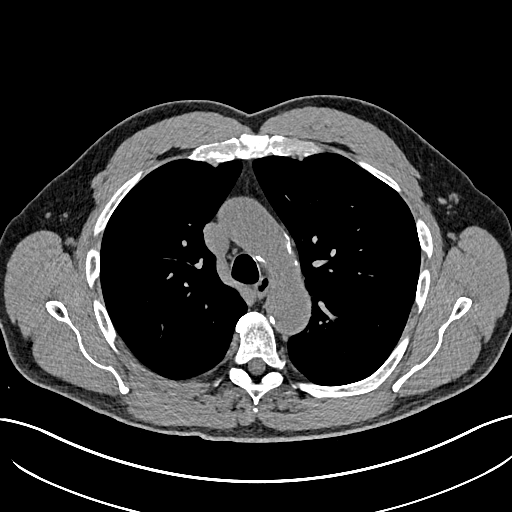
[im 112/162  lung]
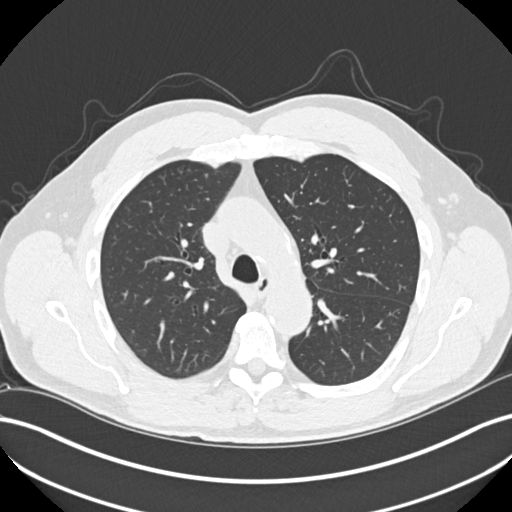
[im 124/162  lung]
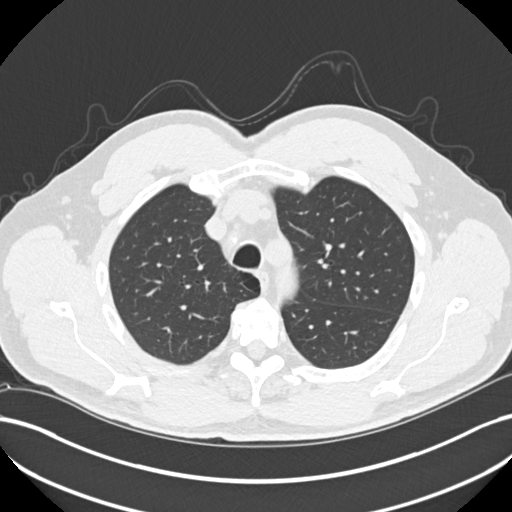
[im 137/162  lung]
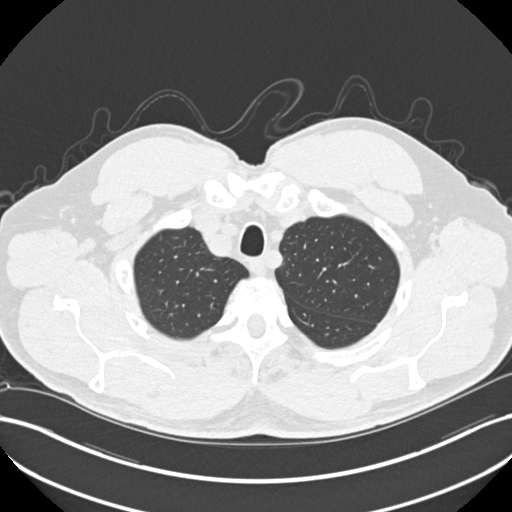
[im 149/162  lung]
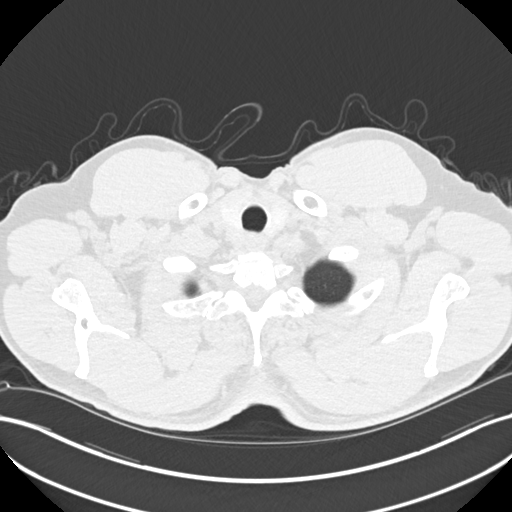

[Series 5: coronals chest 2.00 cor · coronal · 0.64mm/px · 3 of 178 slices shown]
[im 36/178  lung]
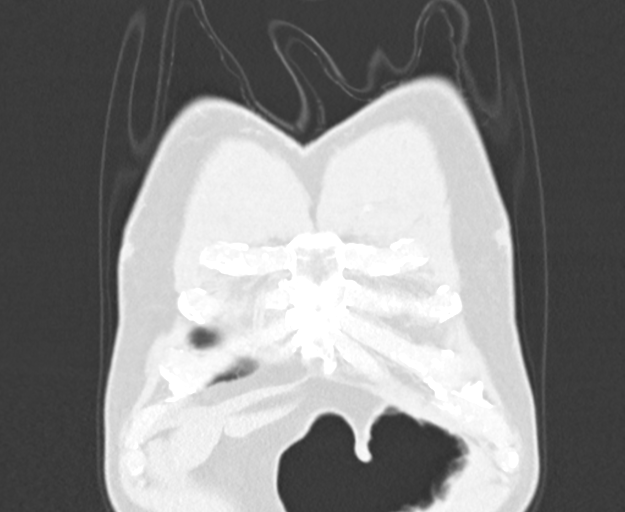
[im 71/178  lung]
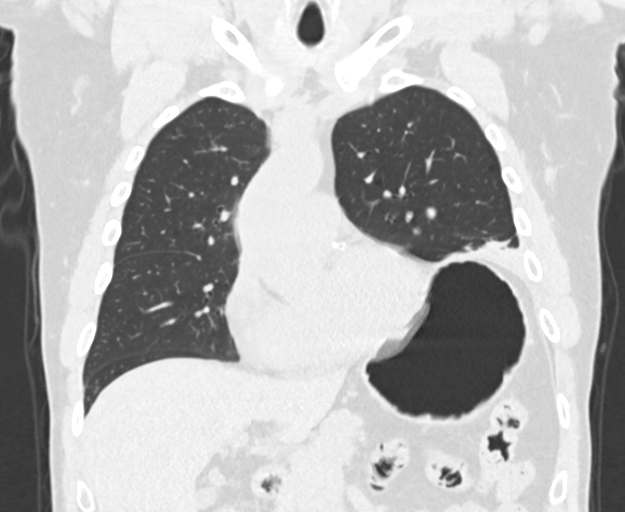
[im 107/178  lung]
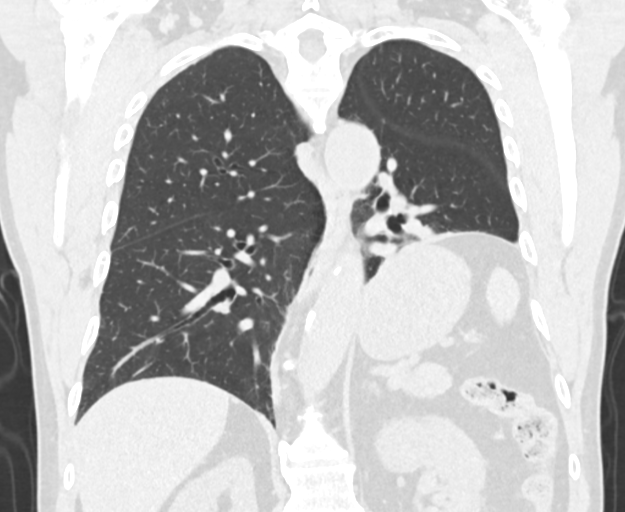

[15 of 36 positions shown; findings below may reference images not displayed]

FINDINGS: Cardiovascular: The heart size appears within normal limits. Three
vessel coronary artery calcifications including left main disease.
No pericardial effusion.

Mediastinum/Nodes: No enlarged mediastinal or axillary lymph nodes.
Thyroid gland, trachea, and esophagus demonstrate no significant
findings.

Lungs/Pleura: Chronic asymmetric elevation of the left hemidiaphragm
with overlying subsegment atelectasis of the left lower lobe and
lingula. There is no pleural effusion. No airspace consolidation. No
pneumothorax. Pulmonary nodule within the right middle lobe measures
0.9 x 0.6 cm (mean diameter 7 mm,) image 103/3. 4 mm nodule in the
right lower lobe noted, image 108/3.

Upper Abdomen: No acute abnormality.

Musculoskeletal: Spondylosis is identified within the thoracic
spine. No acute or suspicious osseous abnormalities.
IMPRESSION: 1. No acute cardiopulmonary abnormalities.
2. Two pulmonary nodules in the right lung are noted. Non-contrast
chest CT at 3-6 months is recommended. If the nodules are stable at
time of repeat CT, then future CT at 18-24 months (from today's
scan) is considered optional for low-risk patients, but is
recommended for high-risk patients. This recommendation follows the
consensus statement: Guidelines for Management of Incidental
Pulmonary Nodules Detected on CT Images: From the [HOSPITAL]
3. Chronic asymmetric elevation of the left hemidiaphragm with
overlying subsegment atelectasis of the left lower lobe and lingula.
Findings are compatible with previously demonstrated left
hemidiaphragmatic paralysis.
4. Aortic atherosclerosis. Three vessel coronary artery
calcifications including left main disease.

Aortic Atherosclerosis (5TXV0-EKG.G).

## 2020-10-27 ENCOUNTER — Telehealth: Payer: Self-pay

## 2020-10-27 MED ORDER — KETOCONAZOLE 2 % EX CREA: 1.0000 "application " | TOPICAL_CREAM | CUTANEOUS | 2 refills | Status: AC

## 2020-10-27 MED ORDER — HYDROCORTISONE 2.5 % EX CREA: TOPICAL_CREAM | CUTANEOUS | 3 refills | Status: DC

## 2020-10-27 NOTE — Telephone Encounter (Signed)
RXs RFs for Ketoconazole and Hydrocortisone.

## 2020-12-16 DIAGNOSIS — R030 Elevated blood-pressure reading, without diagnosis of hypertension: Secondary | ICD-10-CM | POA: Diagnosis not present

## 2021-02-17 DIAGNOSIS — Z01818 Encounter for other preprocedural examination: Secondary | ICD-10-CM | POA: Diagnosis not present

## 2021-02-20 DIAGNOSIS — K635 Polyp of colon: Secondary | ICD-10-CM | POA: Diagnosis not present

## 2021-02-20 DIAGNOSIS — D123 Benign neoplasm of transverse colon: Secondary | ICD-10-CM | POA: Diagnosis not present

## 2021-02-20 DIAGNOSIS — K641 Second degree hemorrhoids: Secondary | ICD-10-CM | POA: Diagnosis not present

## 2021-02-20 DIAGNOSIS — Z1211 Encounter for screening for malignant neoplasm of colon: Secondary | ICD-10-CM | POA: Diagnosis not present

## 2021-02-20 DIAGNOSIS — D126 Benign neoplasm of colon, unspecified: Secondary | ICD-10-CM | POA: Diagnosis not present

## 2021-03-02 DIAGNOSIS — E349 Endocrine disorder, unspecified: Secondary | ICD-10-CM | POA: Diagnosis not present

## 2021-03-02 DIAGNOSIS — Z1331 Encounter for screening for depression: Secondary | ICD-10-CM | POA: Diagnosis not present

## 2021-03-02 DIAGNOSIS — R918 Other nonspecific abnormal finding of lung field: Secondary | ICD-10-CM | POA: Diagnosis not present

## 2021-03-02 DIAGNOSIS — Z0000 Encounter for general adult medical examination without abnormal findings: Secondary | ICD-10-CM | POA: Diagnosis not present

## 2021-03-02 DIAGNOSIS — F419 Anxiety disorder, unspecified: Secondary | ICD-10-CM | POA: Diagnosis not present

## 2021-04-29 ENCOUNTER — Other Ambulatory Visit: Payer: Self-pay

## 2021-04-29 ENCOUNTER — Ambulatory Visit: Payer: PPO | Admitting: Dermatology

## 2021-04-29 DIAGNOSIS — L814 Other melanin hyperpigmentation: Secondary | ICD-10-CM

## 2021-04-29 DIAGNOSIS — L821 Other seborrheic keratosis: Secondary | ICD-10-CM | POA: Diagnosis not present

## 2021-04-29 DIAGNOSIS — D225 Melanocytic nevi of trunk: Secondary | ICD-10-CM

## 2021-04-29 DIAGNOSIS — L570 Actinic keratosis: Secondary | ICD-10-CM

## 2021-04-29 DIAGNOSIS — D229 Melanocytic nevi, unspecified: Secondary | ICD-10-CM | POA: Diagnosis not present

## 2021-04-29 DIAGNOSIS — D1800 Hemangioma unspecified site: Secondary | ICD-10-CM | POA: Diagnosis not present

## 2021-04-29 DIAGNOSIS — L219 Seborrheic dermatitis, unspecified: Secondary | ICD-10-CM

## 2021-04-29 DIAGNOSIS — L918 Other hypertrophic disorders of the skin: Secondary | ICD-10-CM | POA: Diagnosis not present

## 2021-04-29 DIAGNOSIS — L578 Other skin changes due to chronic exposure to nonionizing radiation: Secondary | ICD-10-CM | POA: Diagnosis not present

## 2021-04-29 DIAGNOSIS — Z1283 Encounter for screening for malignant neoplasm of skin: Secondary | ICD-10-CM

## 2021-04-29 MED ORDER — HYDROCORTISONE 2.5 % EX CREA: TOPICAL_CREAM | CUTANEOUS | 11 refills | Status: DC

## 2021-04-29 MED ORDER — KETOCONAZOLE 2 % EX CREA
1.0000 | TOPICAL_CREAM | Freq: Every day | CUTANEOUS | 11 refills | Status: AC
Start: 2021-04-29 — End: 2023-04-19

## 2021-04-29 NOTE — Progress Notes (Signed)
Follow-Up Visit   Subjective  Anthony Welch. is a 75 y.o. male who presents for the following: Annual Exam (UBSE today) and Follow-up (Seb derm follow up of chest - Ketoconazole 2% cream, HC 2.5% cream). The patient presents for Upper Body Skin Exam (UBSE) for skin cancer screening and mole check.  The following portions of the chart were reviewed this encounter and updated as appropriate:   Tobacco  Allergies  Meds  Problems  Med Hx  Surg Hx  Fam Hx      Review of Systems:  No other skin or systemic complaints except as noted in HPI or Assessment and Plan.  Objective  Well appearing patient in no apparent distress; mood and affect are within normal limits.  All skin waist up examined.  Left scapula 0.5 cm flesh colored papule  Scalp (19) Erythematous thin papules/macules with gritty scale.   Chest - Medial [facility]) Pinkness   Assessment & Plan   Acrochordons (Skin Tags) - Fleshy, skin-colored pedunculated papules - Benign appearing.  - Observe. - If desired, they can be removed with an in office procedure that is not covered by insurance. - Please call the clinic if you notice any new or changing lesions.  Lentigines - Scattered tan macules - Due to sun exposure - Benign-appering, observe - Recommend daily broad spectrum sunscreen SPF 30+ to sun-exposed areas, reapply every 2 hours as needed. - Call for any changes  Seborrheic Keratoses - Stuck-on, waxy, tan-Pla papules and/or plaques  - Benign-appearing - Discussed benign etiology and prognosis. - Observe - Call for any changes  Melanocytic Nevi - Tan-Heydt and/or pink-flesh-colored symmetric macules and papules - Benign appearing on exam today - Observation - Call clinic for new or changing moles - Recommend daily use of broad spectrum spf 30+ sunscreen to sun-exposed areas.   Hemangiomas - Red papules - Discussed benign nature - Observe - Call for any changes  Actinic Damage -  Chronic condition, secondary to cumulative UV/sun exposure - diffuse scaly erythematous macules with underlying dyspigmentation - Recommend daily broad spectrum sunscreen SPF 30+ to sun-exposed areas, reapply every 2 hours as needed.  - Staying in the shade or wearing long sleeves, sun glasses (UVA+UVB protection) and wide brim hats (4-inch brim around the entire circumference of the hat) are also recommended for sun protection.  - Call for new or changing lesions.  Skin cancer screening performed today.  Nevus Left scapula Nevus vs Neurofibroma - Benign appearing. Observe.  AK (actinic keratosis) (19) Scalp Actinic Damage - Severe, confluent actinic changes with pre-cancerous actinic keratoses  - Severe, chronic, not at goal, secondary to cumulative UV radiation exposure over time - diffuse scaly erythematous macules and papules with underlying dyspigmentation - Discussed Prescription "Field Treatment" for Severe, Chronic Confluent Actinic Changes with Pre-Cancerous Actinic Keratoses Field treatment involves treatment of an entire area of skin that has confluent Actinic Changes (Sun/ Ultraviolet light damage) and PreCancerous Actinic Keratoses by method of PhotoDynamic Therapy (PDT) and/or prescription Topical Chemotherapy agents such as 5-fluorouracil, 5-fluorouracil/calcipotriene, and/or imiquimod.  The purpose is to decrease the number of clinically evident and subclinical PreCancerous lesions to prevent progression to development of skin cancer by chemically destroying early precancer changes that may or may not be visible.  It has been shown to reduce the risk of developing skin cancer in the treated area. As a result of treatment, redness, scaling, crusting, and open sores may occur during treatment course. One or more than one of these methods  may be used and may have to be used several times to control, suppress and eliminate the PreCancerous changes. Discussed treatment course, expected  reaction, and possible side effects. - Recommend daily broad spectrum sunscreen SPF 30+ to sun-exposed areas, reapply every 2 hours as needed.  - Staying in the shade or wearing long sleeves, sun glasses (UVA+UVB protection) and wide brim hats (4-inch brim around the entire circumference of the hat) are also recommended. - Call for new or changing lesions.  - Start 5-fluorouracil/calcipotriene cream twice a day for 7 days to affected areas including scalp. Prescription sent to [facility]. Patient advised they will receive an email to purchase the medication online and have it sent to their home. Patient provided with handout reviewing treatment course and side effects and advised to call or message [place] on MyChart with any concerns. Advised patient to start in October.  Destruction of lesion - Scalp Complexity: simple   Destruction method: cryotherapy   Informed consent: discussed and consent obtained   Timeout:  patient name, date of birth, surgical site, and procedure verified Lesion destroyed using liquid nitrogen: Yes   Region frozen until ice ball extended beyond lesion: Yes   Outcome: patient tolerated procedure well with no complications   Post-procedure details: wound care instructions given    Seborrheic dermatitis Chest - Medial ([facility]) Continue Ketoconazole 2% cream 3 times per week prn, Hydrocortisone 2.5% cream 3 times per week prn  Seborrheic Dermatitis  -  is a chronic persistent rash characterized by pinkness and scaling most commonly of the mid face but also can occur on the scalp (dandruff), ears; mid chest and mid back. It tends to be exacerbated by stress and cooler weather.  People who have neurologic disease may experience new onset or exacerbation of existing seborrheic dermatitis.  The condition is not curable but treatable and can be controlled.  ketoconazole (NIZORAL) 2 % cream - Chest - Medial [facility]) Apply 1 application topically daily.  Monday, Wednesday, Friday Related Medications hydrocortisone 2.5 % cream Apply topically as directed. Apply nightly three times a week, alternating with the ketoconazole  Skin cancer screening  Return in about 1 year (around 04/29/2022).  I, Ashok Cordia, CMA, am acting as scribe for Sarina Ser, MD .  Documentation: I have reviewed the above documentation for accuracy and completeness, and I agree with the above.  Sarina Ser, MD

## 2021-04-29 NOTE — Patient Instructions (Signed)

## 2021-05-05 ENCOUNTER — Encounter: Payer: Self-pay | Admitting: Dermatology

## 2021-05-06 ENCOUNTER — Other Ambulatory Visit: Payer: Self-pay | Admitting: Specialist

## 2021-05-06 DIAGNOSIS — R0600 Dyspnea, unspecified: Secondary | ICD-10-CM | POA: Diagnosis not present

## 2021-05-06 DIAGNOSIS — Z01818 Encounter for other preprocedural examination: Secondary | ICD-10-CM | POA: Diagnosis not present

## 2021-05-06 DIAGNOSIS — R0609 Other forms of dyspnea: Secondary | ICD-10-CM

## 2021-05-06 DIAGNOSIS — R918 Other nonspecific abnormal finding of lung field: Secondary | ICD-10-CM | POA: Diagnosis not present

## 2021-05-06 DIAGNOSIS — J986 Disorders of diaphragm: Secondary | ICD-10-CM | POA: Diagnosis not present

## 2021-05-19 DIAGNOSIS — H33011 Retinal detachment with single break, right eye: Secondary | ICD-10-CM | POA: Diagnosis not present

## 2021-05-19 DIAGNOSIS — H2513 Age-related nuclear cataract, bilateral: Secondary | ICD-10-CM | POA: Diagnosis not present

## 2021-05-20 DIAGNOSIS — H3561 Retinal hemorrhage, right eye: Secondary | ICD-10-CM | POA: Diagnosis not present

## 2021-05-20 DIAGNOSIS — H43811 Vitreous degeneration, right eye: Secondary | ICD-10-CM | POA: Diagnosis not present

## 2021-05-20 DIAGNOSIS — H4311 Vitreous hemorrhage, right eye: Secondary | ICD-10-CM | POA: Diagnosis not present

## 2021-06-10 ENCOUNTER — Other Ambulatory Visit: Payer: Self-pay

## 2021-06-10 ENCOUNTER — Ambulatory Visit
Admission: RE | Admit: 2021-06-10 | Discharge: 2021-06-10 | Disposition: A | Discharge status: Home / Self Care | Payer: PPO | Source: Ambulatory Visit | Attending: Specialist | Admitting: Specialist

## 2021-06-10 DIAGNOSIS — J986 Disorders of diaphragm: Secondary | ICD-10-CM | POA: Diagnosis not present

## 2021-06-10 DIAGNOSIS — J9811 Atelectasis: Secondary | ICD-10-CM | POA: Diagnosis not present

## 2021-06-10 DIAGNOSIS — R0609 Other forms of dyspnea: Secondary | ICD-10-CM

## 2021-06-10 DIAGNOSIS — R0600 Dyspnea, unspecified: Secondary | ICD-10-CM | POA: Insufficient documentation

## 2021-06-10 DIAGNOSIS — I700 Atherosclerosis of aorta: Secondary | ICD-10-CM | POA: Diagnosis not present

## 2021-06-10 DIAGNOSIS — R911 Solitary pulmonary nodule: Secondary | ICD-10-CM | POA: Diagnosis not present

## 2021-06-10 DIAGNOSIS — R918 Other nonspecific abnormal finding of lung field: Secondary | ICD-10-CM | POA: Insufficient documentation

## 2021-11-26 ENCOUNTER — Ambulatory Visit: Payer: PPO | Admitting: Dermatology

## 2021-11-26 ENCOUNTER — Other Ambulatory Visit: Payer: Self-pay

## 2021-11-26 DIAGNOSIS — L570 Actinic keratosis: Secondary | ICD-10-CM | POA: Diagnosis not present

## 2021-11-26 DIAGNOSIS — L578 Other skin changes due to chronic exposure to nonionizing radiation: Secondary | ICD-10-CM

## 2021-11-26 MED ORDER — FLUOROURACIL 5 % EX CREA: TOPICAL_CREAM | Freq: Two times a day (BID) | CUTANEOUS | 1 refills | Status: DC

## 2021-11-26 NOTE — Progress Notes (Signed)
° °  Follow-Up Visit   Subjective  Anthony Welch. is a 76 y.o. male who presents for the following: Actinic Keratosis (6 month follow up of scalp treated with LN2. He did not use 5FU/Calcipotriene cream ).  The following portions of the chart were reviewed this encounter and updated as appropriate:   Tobacco   Allergies   Meds   Problems   Med Hx   Surg Hx   Fam Hx      Review of Systems:  No other skin or systemic complaints except as noted in HPI or Assessment and Plan.  Objective  Well appearing patient in no apparent distress; mood and affect are within normal limits.  A focused examination was performed including face, scalp. Relevant physical exam findings are noted in the Assessment and Plan.  Scalp (5) Erythematous thin papules/macules with gritty scale.    Assessment & Plan   AK (actinic keratosis) (5) Scalp  Actinic Damage - Severe, confluent actinic changes with pre-cancerous actinic keratoses  - Severe, chronic, not at goal, secondary to cumulative UV radiation exposure over time - diffuse scaly erythematous macules and papules with underlying dyspigmentation - Discussed Prescription "Field Treatment" for Severe, Chronic Confluent Actinic Changes with Pre-Cancerous Actinic Keratoses Field treatment involves treatment of an entire area of skin that has confluent Actinic Changes (Sun/ Ultraviolet light damage) and PreCancerous Actinic Keratoses by method of PhotoDynamic Therapy (PDT) and/or prescription Topical Chemotherapy agents such as 5-fluorouracil, 5-fluorouracil/calcipotriene, and/or imiquimod.  The purpose is to decrease the number of clinically evident and subclinical PreCancerous lesions to prevent progression to development of skin cancer by chemically destroying early precancer changes that may or may not be visible.  It has been shown to reduce the risk of developing skin cancer in the treated area. As a result of treatment, redness, scaling, crusting, and open  sores may occur during treatment course. One or more than one of these methods may be used and may have to be used several times to control, suppress and eliminate the PreCancerous changes. Discussed treatment course, expected reaction, and possible side effects. - Recommend daily broad spectrum sunscreen SPF 30+ to sun-exposed areas, reapply every 2 hours as needed.  - Staying in the shade or wearing long sleeves, sun glasses (UVA+UVB protection) and wide brim hats (4-inch brim around the entire circumference of the hat) are also recommended. - Call for new or changing lesions.  Start Fluorouracil 5%/Calcipotriene cream bid x 7 days  fluorouracil (EFUDEX) 5 % cream - Scalp Apply topically 2 (two) times daily. For 1 week  Destruction of lesion - Scalp Complexity: simple   Destruction method: cryotherapy   Informed consent: discussed and consent obtained   Timeout:  patient name, date of birth, surgical site, and procedure verified Lesion destroyed using liquid nitrogen: Yes   Region frozen until ice ball extended beyond lesion: Yes   Outcome: patient tolerated procedure well with no complications   Post-procedure details: wound care instructions given     Return in about 6 months (around 05/26/2022) for AK follow up. Documentation: I have reviewed the above documentation for accuracy and completeness, and I agree with the above.  Sarina Ser, MD

## 2021-11-26 NOTE — Patient Instructions (Signed)

## 2021-11-30 ENCOUNTER — Encounter: Payer: Self-pay | Admitting: Dermatology

## 2021-12-23 DIAGNOSIS — E349 Endocrine disorder, unspecified: Secondary | ICD-10-CM | POA: Diagnosis not present

## 2021-12-23 DIAGNOSIS — R918 Other nonspecific abnormal finding of lung field: Secondary | ICD-10-CM | POA: Diagnosis not present

## 2021-12-23 DIAGNOSIS — R1032 Left lower quadrant pain: Secondary | ICD-10-CM | POA: Diagnosis not present

## 2021-12-24 DIAGNOSIS — E349 Endocrine disorder, unspecified: Secondary | ICD-10-CM | POA: Diagnosis not present

## 2022-01-27 DIAGNOSIS — E349 Endocrine disorder, unspecified: Secondary | ICD-10-CM | POA: Diagnosis not present

## 2022-02-25 DIAGNOSIS — E349 Endocrine disorder, unspecified: Secondary | ICD-10-CM | POA: Diagnosis not present

## 2022-04-15 DIAGNOSIS — E349 Endocrine disorder, unspecified: Secondary | ICD-10-CM | POA: Diagnosis not present

## 2022-04-20 DIAGNOSIS — Z0000 Encounter for general adult medical examination without abnormal findings: Secondary | ICD-10-CM | POA: Diagnosis not present

## 2022-04-20 DIAGNOSIS — Z13220 Encounter for screening for lipoid disorders: Secondary | ICD-10-CM | POA: Diagnosis not present

## 2022-04-20 DIAGNOSIS — F419 Anxiety disorder, unspecified: Secondary | ICD-10-CM | POA: Diagnosis not present

## 2022-04-20 DIAGNOSIS — E349 Endocrine disorder, unspecified: Secondary | ICD-10-CM | POA: Diagnosis not present

## 2022-04-20 DIAGNOSIS — R918 Other nonspecific abnormal finding of lung field: Secondary | ICD-10-CM | POA: Diagnosis not present

## 2022-04-20 DIAGNOSIS — R413 Other amnesia: Secondary | ICD-10-CM | POA: Diagnosis not present

## 2022-04-23 DIAGNOSIS — J209 Acute bronchitis, unspecified: Secondary | ICD-10-CM | POA: Diagnosis not present

## 2022-04-23 DIAGNOSIS — J019 Acute sinusitis, unspecified: Secondary | ICD-10-CM | POA: Diagnosis not present

## 2022-04-23 DIAGNOSIS — Z03818 Encounter for observation for suspected exposure to other biological agents ruled out: Secondary | ICD-10-CM | POA: Diagnosis not present

## 2022-04-23 DIAGNOSIS — J302 Other seasonal allergic rhinitis: Secondary | ICD-10-CM | POA: Diagnosis not present

## 2022-04-23 DIAGNOSIS — J029 Acute pharyngitis, unspecified: Secondary | ICD-10-CM | POA: Diagnosis not present

## 2022-04-23 DIAGNOSIS — B9689 Other specified bacterial agents as the cause of diseases classified elsewhere: Secondary | ICD-10-CM | POA: Diagnosis not present

## 2022-04-29 DIAGNOSIS — H04123 Dry eye syndrome of bilateral lacrimal glands: Secondary | ICD-10-CM | POA: Diagnosis not present

## 2022-04-29 DIAGNOSIS — H1013 Acute atopic conjunctivitis, bilateral: Secondary | ICD-10-CM | POA: Diagnosis not present

## 2022-04-29 DIAGNOSIS — H2513 Age-related nuclear cataract, bilateral: Secondary | ICD-10-CM | POA: Diagnosis not present

## 2022-05-05 ENCOUNTER — Ambulatory Visit: Payer: PPO | Admitting: Dermatology

## 2022-05-05 DIAGNOSIS — L578 Other skin changes due to chronic exposure to nonionizing radiation: Secondary | ICD-10-CM

## 2022-05-05 DIAGNOSIS — L570 Actinic keratosis: Secondary | ICD-10-CM

## 2022-05-05 DIAGNOSIS — L821 Other seborrheic keratosis: Secondary | ICD-10-CM

## 2022-05-05 DIAGNOSIS — L814 Other melanin hyperpigmentation: Secondary | ICD-10-CM | POA: Diagnosis not present

## 2022-05-05 DIAGNOSIS — D1800 Hemangioma unspecified site: Secondary | ICD-10-CM

## 2022-05-05 DIAGNOSIS — Z1283 Encounter for screening for malignant neoplasm of skin: Secondary | ICD-10-CM | POA: Diagnosis not present

## 2022-05-05 DIAGNOSIS — D229 Melanocytic nevi, unspecified: Secondary | ICD-10-CM | POA: Diagnosis not present

## 2022-05-05 MED ORDER — FLUOROURACIL 5 % EX CREA: TOPICAL_CREAM | Freq: Two times a day (BID) | CUTANEOUS | 1 refills | Status: DC

## 2022-05-05 NOTE — Patient Instructions (Signed)
Due to recent changes in healthcare laws, you may see results of your pathology and/or laboratory studies on MyChart before the doctors have had a chance to review them. We understand that in some cases there may be results that are confusing or concerning to you. Please understand that not all results are received at the same time and often the doctors may need to interpret multiple results in order to provide you with the best plan of care or course of treatment. Therefore, we ask that you please give us 2 business days to thoroughly review all your results before contacting the office for clarification. Should we see a critical lab result, you will be contacted sooner.   If You Need Anything After Your Visit  If you have any questions or concerns for your doctor, please call our main line at [phone] and press option 4 to reach your doctor's medical assistant. If no one answers, please leave a voicemail as directed and we will return your call as soon as possible. Messages left after 4 pm will be answered the following business day.   You may also send us a message via MyChart. We typically respond to MyChart messages within 1-2 business days.  For prescription refills, please ask your pharmacy to contact our office. Our fax number is [phone].  If you have an urgent issue when the clinic is closed that cannot wait until the next business day, you can page your doctor at the number below.    Please note that while we do our best to be available for urgent issues outside of office hours, we are not available 24/7.   If you have an urgent issue and are unable to reach us, you may choose to seek medical care at your doctor's office, retail clinic, urgent care center, or emergency room.  If you have a medical emergency, please immediately call 911 or go to the emergency department.  Pager Numbers  - Dr. Kowalski: [phone]  - Dr. Moye: [phone]  - Dr. Stewart:  [phone]  In the event of inclement weather, please call our main line at [phone] for an update on the status of any delays or closures.  Dermatology Medication Tips: Please keep the boxes that topical medications come in in order to help keep track of the instructions about where and how to use these. Pharmacies typically print the medication instructions only on the boxes and not directly on the medication tubes.   If your medication is too expensive, please contact our office at [phone] option 4 or send us a message through MyChart.   We are unable to tell what your co-pay for medications will be in advance as this is different depending on your insurance coverage. However, we may be able to find a substitute medication at lower cost or fill out paperwork to get insurance to cover a needed medication.   If a prior authorization is required to get your medication covered by your insurance company, please allow us 1-2 business days to complete this process.  Drug prices often vary depending on where the prescription is filled and some pharmacies may offer cheaper prices.  The website www.goodrx.com contains coupons for medications through different pharmacies. The prices here do not account for what the cost may be with help from insurance (it may be cheaper with your insurance), but the website can give you the price if you did not use any insurance.  - You can print the associated coupon and take it with   your prescription to the pharmacy.  - You may also stop by our office during regular business hours and pick up a GoodRx coupon card.  - If you need your prescription sent electronically to a different pharmacy, notify our office through [facility] MyChart or by phone at [phone] option 4.     Si Usted Necesita Algo Despus de Su Visita  Tambin puede enviarnos un mensaje a travs de MyChart. Por lo general respondemos a los mensajes de MyChart en el transcurso de 1 a 2  das hbiles.  Para renovar recetas, por favor pida a su farmacia que se ponga en contacto con nuestra oficina. Nuestro nmero de fax es el [phone].  Si tiene un asunto urgente cuando la clnica est cerrada y que no puede esperar hasta el siguiente da hbil, puede llamar/localizar a su doctor(a) al nmero que aparece a continuacin.   Por favor, tenga en cuenta que aunque hacemos todo lo posible para estar disponibles para asuntos urgentes fuera del horario de oficina, no estamos disponibles las 24 horas del da, los 7 das de la semana.   Si tiene un problema urgente y no puede comunicarse con nosotros, puede optar por buscar atencin mdica  en el consultorio de su doctor(a), en una clnica privada, en un centro de atencin urgente o en una sala de emergencias.  Si tiene una emergencia mdica, por favor llame inmediatamente al 911 o vaya a la sala de emergencias.  Nmeros de bper  - Dr. Kowalski: [phone]  - Dra. Moye: [phone]  - Dra. Stewart: [phone]  En caso de inclemencias del tiempo, por favor llame a nuestra lnea principal al [phone] para una actualizacin sobre el estado de cualquier retraso o cierre.  Consejos para la medicacin en dermatologa: Por favor, guarde las cajas en las que vienen los medicamentos de uso tpico para ayudarle a seguir las instrucciones sobre dnde y cmo usarlos. Las farmacias generalmente imprimen las instrucciones del medicamento slo en las cajas y no directamente en los tubos del medicamento.   Si su medicamento es muy caro, por favor, pngase en contacto con nuestra oficina llamando al [phone] y presione la opcin 4 o envenos un mensaje a travs de MyChart.   No podemos decirle cul ser su copago por los medicamentos por adelantado ya que esto es diferente dependiendo de la cobertura de su seguro. Sin embargo, es posible que podamos encontrar un medicamento sustituto a menor costo o llenar un formulario para que el  seguro cubra el medicamento que se considera necesario.   Si se requiere una autorizacin previa para que su compaa de seguros cubra su medicamento, por favor permtanos de 1 a 2 das hbiles para completar este proceso.  Los precios de los medicamentos varan con frecuencia dependiendo del lugar de dnde se surte la receta y alguna farmacias pueden ofrecer precios ms baratos.  El sitio web www.goodrx.com tiene cupones para medicamentos de diferentes farmacias. Los precios aqu no tienen en cuenta lo que podra costar con la ayuda del seguro (puede ser ms barato con su seguro), pero el sitio web puede darle el precio si no utiliz ningn seguro.  - Puede imprimir el cupn correspondiente y llevarlo con su receta a la farmacia.  - Tambin puede pasar por nuestra oficina durante el horario de atencin regular y recoger una tarjeta de cupones de GoodRx.  - Si necesita que su receta se enve electrnicamente a una farmacia diferente, informe a nuestra oficina a travs de MyChart de [place]   o por telfono llamando al [phone] y presione la opcin 4.  

## 2022-05-05 NOTE — Progress Notes (Unsigned)
   Follow-Up Visit   Subjective  Anthony Welch. is a 76 y.o. male who presents for the following: Follow-up (6 months f/u hx of Aks on the scalp, treated with 5FU/Calcipotriene cream several months ago with a good response ). The patient presents for Upper Body Skin Exam (UBSE) for skin cancer screening and mole check.  The patient has spots, moles and lesions to be evaluated, some may be new or changing and the patient has concerns that these could be cancer.     The following portions of the chart were reviewed this encounter and updated as appropriate:       Review of Systems:  No other skin or systemic complaints except as noted in HPI or Assessment and Plan.  Objective  Well appearing patient in no apparent distress; mood and affect are within normal limits.  A focused examination was performed including face,scalp. Relevant physical exam findings are noted in the Assessment and Plan.  Scalp Erythematous thin papules/macules with gritty scale.     Assessment & Plan  AK (actinic keratosis) Scalp  Actinic keratoses are precancerous spots that appear secondary to cumulative UV radiation exposure/sun exposure over time. They are chronic with expected duration over 1 year. A portion of actinic keratoses will progress to squamous cell carcinoma of the skin. It is not possible to reliably predict which spots will progress to skin cancer and so treatment is recommended to prevent development of skin cancer.  Recommend daily broad spectrum sunscreen SPF 30+ to sun-exposed areas, reapply every 2 hours as needed.  Recommend staying in the shade or wearing long sleeves, sun glasses (UVA+UVB protection) and wide brim hats (4-inch brim around the entire circumference of the hat). Call for new or changing lesions.   Re start Efudex/Calcipotriene cream apply to scalp twice a day x 7 days then stop   Related Medications fluorouracil (EFUDEX) 5 % cream Apply topically 2 (two) times daily.  For 1 week  Lentigines - Scattered tan macules - Due to sun exposure - Benign-appearing, observe - Recommend daily broad spectrum sunscreen SPF 30+ to sun-exposed areas, reapply every 2 hours as needed. - Call for any changes  Seborrheic Keratoses - Stuck-on, waxy, tan-Broshears papules and/or plaques  - Benign-appearing - Discussed benign etiology and prognosis. - Observe - Call for any changes  Melanocytic Nevi - Tan-Angelos and/or pink-flesh-colored symmetric macules and papules - Benign appearing on exam today - Observation - Call clinic for new or changing moles - Recommend daily use of broad spectrum spf 30+ sunscreen to sun-exposed areas.   Hemangiomas - Red papules - Discussed benign nature - Observe - Call for any changes  Actinic Damage - Chronic condition, secondary to cumulative UV/sun exposure - diffuse scaly erythematous macules with underlying dyspigmentation - Recommend daily broad spectrum sunscreen SPF 30+ to sun-exposed areas, reapply every 2 hours as needed.  - Staying in the shade or wearing long sleeves, sun glasses (UVA+UVB protection) and wide brim hats (4-inch brim around the entire circumference of the hat) are also recommended for sun protection.  - Call for new or changing lesions.  Skin cancer screening performed today.    Return in about 1 year (around 05/06/2023) for Aks .  IMarye Round, CMA, am acting as scribe for Sarina Ser, MD .

## 2022-05-07 ENCOUNTER — Encounter: Payer: Self-pay | Admitting: Dermatology

## 2022-05-12 DIAGNOSIS — R0609 Other forms of dyspnea: Secondary | ICD-10-CM | POA: Diagnosis not present

## 2022-05-12 DIAGNOSIS — J986 Disorders of diaphragm: Secondary | ICD-10-CM | POA: Diagnosis not present

## 2022-05-12 DIAGNOSIS — R911 Solitary pulmonary nodule: Secondary | ICD-10-CM | POA: Diagnosis not present

## 2022-05-12 DIAGNOSIS — R918 Other nonspecific abnormal finding of lung field: Secondary | ICD-10-CM | POA: Diagnosis not present

## 2022-05-12 DIAGNOSIS — R0600 Dyspnea, unspecified: Secondary | ICD-10-CM | POA: Diagnosis not present

## 2022-05-14 ENCOUNTER — Other Ambulatory Visit: Payer: Self-pay | Admitting: Specialist

## 2022-05-14 DIAGNOSIS — R0609 Other forms of dyspnea: Secondary | ICD-10-CM

## 2022-05-14 DIAGNOSIS — R918 Other nonspecific abnormal finding of lung field: Secondary | ICD-10-CM

## 2022-05-14 DIAGNOSIS — J986 Disorders of diaphragm: Secondary | ICD-10-CM

## 2022-05-20 DIAGNOSIS — E349 Endocrine disorder, unspecified: Secondary | ICD-10-CM | POA: Diagnosis not present

## 2022-05-24 ENCOUNTER — Ambulatory Visit
Admission: RE | Admit: 2022-05-24 | Discharge: 2022-05-24 | Disposition: A | Discharge status: Home / Self Care | Payer: PPO | Source: Ambulatory Visit | Attending: Specialist | Admitting: Specialist

## 2022-05-24 DIAGNOSIS — R0609 Other forms of dyspnea: Secondary | ICD-10-CM

## 2022-05-24 DIAGNOSIS — R918 Other nonspecific abnormal finding of lung field: Secondary | ICD-10-CM

## 2022-05-24 DIAGNOSIS — R911 Solitary pulmonary nodule: Secondary | ICD-10-CM | POA: Diagnosis not present

## 2022-05-24 DIAGNOSIS — J986 Disorders of diaphragm: Secondary | ICD-10-CM

## 2022-06-01 ENCOUNTER — Ambulatory Visit: Payer: PPO | Admitting: Dermatology

## 2022-06-10 DIAGNOSIS — E349 Endocrine disorder, unspecified: Secondary | ICD-10-CM | POA: Diagnosis not present

## 2022-08-04 DIAGNOSIS — H93291 Other abnormal auditory perceptions, right ear: Secondary | ICD-10-CM | POA: Diagnosis not present

## 2022-08-04 DIAGNOSIS — H6123 Impacted cerumen, bilateral: Secondary | ICD-10-CM | POA: Diagnosis not present

## 2022-08-12 DIAGNOSIS — E349 Endocrine disorder, unspecified: Secondary | ICD-10-CM | POA: Diagnosis not present

## 2022-09-16 DIAGNOSIS — E349 Endocrine disorder, unspecified: Secondary | ICD-10-CM | POA: Diagnosis not present

## 2022-10-21 DIAGNOSIS — E349 Endocrine disorder, unspecified: Secondary | ICD-10-CM | POA: Diagnosis not present

## 2022-11-25 DIAGNOSIS — E349 Endocrine disorder, unspecified: Secondary | ICD-10-CM | POA: Diagnosis not present

## 2022-12-30 DIAGNOSIS — E349 Endocrine disorder, unspecified: Secondary | ICD-10-CM | POA: Diagnosis not present

## 2023-01-20 DIAGNOSIS — E349 Endocrine disorder, unspecified: Secondary | ICD-10-CM | POA: Diagnosis not present

## 2023-01-24 DIAGNOSIS — F419 Anxiety disorder, unspecified: Secondary | ICD-10-CM | POA: Diagnosis not present

## 2023-01-24 DIAGNOSIS — R194 Change in bowel habit: Secondary | ICD-10-CM | POA: Diagnosis not present

## 2023-02-24 DIAGNOSIS — E349 Endocrine disorder, unspecified: Secondary | ICD-10-CM | POA: Diagnosis not present

## 2023-03-08 ENCOUNTER — Ambulatory Visit: Payer: PPO | Admitting: Dermatology

## 2023-03-08 VITALS — BP 146/85 | HR 55

## 2023-03-08 DIAGNOSIS — L821 Other seborrheic keratosis: Secondary | ICD-10-CM | POA: Diagnosis not present

## 2023-03-08 DIAGNOSIS — L219 Seborrheic dermatitis, unspecified: Secondary | ICD-10-CM | POA: Diagnosis not present

## 2023-03-08 DIAGNOSIS — D229 Melanocytic nevi, unspecified: Secondary | ICD-10-CM | POA: Diagnosis not present

## 2023-03-08 DIAGNOSIS — Z79899 Other long term (current) drug therapy: Secondary | ICD-10-CM

## 2023-03-08 DIAGNOSIS — Z7189 Other specified counseling: Secondary | ICD-10-CM | POA: Diagnosis not present

## 2023-03-08 DIAGNOSIS — L814 Other melanin hyperpigmentation: Secondary | ICD-10-CM

## 2023-03-08 DIAGNOSIS — Z1283 Encounter for screening for malignant neoplasm of skin: Secondary | ICD-10-CM | POA: Diagnosis not present

## 2023-03-08 DIAGNOSIS — Z5111 Encounter for antineoplastic chemotherapy: Secondary | ICD-10-CM

## 2023-03-08 DIAGNOSIS — L578 Other skin changes due to chronic exposure to nonionizing radiation: Secondary | ICD-10-CM | POA: Diagnosis not present

## 2023-03-08 DIAGNOSIS — L570 Actinic keratosis: Secondary | ICD-10-CM

## 2023-03-08 MED ORDER — FLUOROURACIL 5 % EX CREA: TOPICAL_CREAM | CUTANEOUS | 1 refills | Status: DC

## 2023-03-08 NOTE — Patient Instructions (Addendum)
- In one month start Fluorouracil 5% cream BID x 7 days to scalp. 5-Fluorouracil Patient Education   Instructions for [facility] Medications  One or more of your medications was sent to the [facility] mail order compounding pharmacy. You will receive an email from them and can purchase the medicine through that link. It will then be mailed to your home at the address you confirmed. If for any reason you do not receive an email from them, please check your spam folder. If you still do not find the email, please let us know. [facility] phone number is [phone].   5-Fluorouracil/Calcipotriene Patient Education   Actinic keratoses are the dry, red scaly spots on the skin caused by sun damage. A portion of these spots can turn into skin cancer with time, and treating them can help prevent development of skin cancer.   Treatment of these spots requires removal of the defective skin cells. There are various ways to remove actinic keratoses, including freezing with liquid nitrogen, treatment with creams, or treatment with a blue light procedure in the office.   5-fluorouracil cream is a topical cream used to treat actinic keratoses. It works by interfering with the growth of abnormal fast-growing skin cells, such as actinic keratoses. These cells peel off and are replaced by healthy ones. THIS CREAM SHOULD BE KEPT OUT OF REACH OF CHILDREN AND PETS AND SHOULD NOT BE USED BY PREGNANT WOMEN.  5-fluorouracil/calcipotriene is a combination of the 5-fluorouracil cream with a vitamin D analog cream called calcipotriene. The calcipotriene alone does not treat actinic keratoses. However, when it is combined with 5-fluorouracil, it helps the 5-fluorouracil treat the actinic keratoses much faster so that the same results can be achieved with a much shorter treatment time.  INSTRUCTIONS FOR 5-FLUOROURACIL/CALCIPOTRIENE CREAM:   5-fluorouracil/calcipotriene cream typically only needs to be used for  4-7 days. A thin layer should be applied twice a day to the treatment areas recommended by your physician.   If your physician prescribed you separate tubes of 5-fluourouracil and calcipotriene, apply a thin layer of 5-fluorouracil followed by a thin layer of calcipotriene.   Avoid contact with your eyes or nostrils. Avoid applying the cream to your eyelids or lips unless directed to apply there by your physician. Do not use 5-fluorouracil/calcipotriene cream on infected or open wounds.   You will develop redness, irritation and some crusting at areas where you have pre-cancer damage/actinic keratoses. IF YOU DEVELOP PAIN, BLEEDING, OR SIGNIFICANT CRUSTING, STOP THE TREATMENT EARLY - you have already gotten a good response and the actinic keratoses should clear up well.  Wash your hands after applying 5-fluorouracil 5% cream on your skin.   A moisturizer or sunscreen with a minimum SPF 30 should be applied each morning.   Once you have finished the treatment, you can apply a thin layer of Vaseline twice a day to irritated areas to soothe and calm the areas more quickly. If you experience significant discomfort, contact your physician.  For some patients it is necessary to repeat the treatment for best results.  SIDE EFFECTS: When using 5-fluorouracil/calcipotriene cream, you may have mild irritation, such as redness, dryness, swelling, or a mild burning sensation. This usually resolves within 2 weeks. The more actinic keratoses you have, the more redness and inflammation you can expect during treatment. Eye irritation has been reported rarely. If this occurs, please let us know.   If you have any trouble using this cream, please send [place] a MyChart message or  call the office. If you have any other questions about this information, please do not hesitate to ask me before you leave the office or contact me on MyChart or by phone.    Due to recent changes in healthcare laws, you may see results of  your pathology and/or laboratory studies on MyChart before the doctors have had a chance to review them. We understand that in some cases there may be results that are confusing or concerning to you. Please understand that not all results are received at the same time and often the doctors may need to interpret multiple results in order to provide you with the best plan of care or course of treatment. Therefore, we ask that you please give [place] 2 business days to thoroughly review all your results before contacting the office for clarification. Should we see a critical lab result, you will be contacted sooner.   If You Need Anything After Your Visit  If you have any questions or concerns for your doctor, please call our main line at [phone] and press option 4 to reach your doctor's medical assistant. If no one answers, please leave a voicemail as directed and we will return your call as soon as possible. Messages left after 4 pm will be answered the following business day.   You may also send [place] a message via MyChart. We typically respond to MyChart messages within 1-2 business days.  For prescription refills, please ask your pharmacy to contact our office. Our fax number is [phone].  If you have an urgent issue when the clinic is closed that cannot wait until the next business day, you can page your doctor at the number below.    Please note that while we do our best to be available for urgent issues outside of office hours, we are not available 24/7.   If you have an urgent issue and are unable to reach [place], you may choose to seek medical care at your doctor's office, retail clinic, urgent care center, or emergency room.  If you have a medical emergency, please immediately call 911 or go to the emergency department.  Pager Numbers  - Dr. Gwen Pounds: [phone]  - Dr. Neale Burly: [phone]  - Dr. Roseanne Reno: [phone]  In the event of inclement weather, please call our main line at  [phone] for an update on the status of any delays or closures.  Dermatology Medication Tips: Please keep the boxes that topical medications come in in order to help keep track of the instructions about where and how to use these. Pharmacies typically print the medication instructions only on the boxes and not directly on the medication tubes.   If your medication is too expensive, please contact our office at [phone] option 4 or send [place] a message through MyChart.   We are unable to tell what your co-pay for medications will be in advance as this is different depending on your insurance coverage. However, we may be able to find a substitute medication at lower cost or fill out paperwork to get insurance to cover a needed medication.   If a prior authorization is required to get your medication covered by your insurance company, please allow [place] 1-2 business days to complete this process.  Drug prices often vary depending on where the prescription is filled and some pharmacies may offer cheaper prices.  The website www.goodrx.com contains coupons for medications through different pharmacies. The prices here do not account for what the cost may be with help  from insurance (it may be cheaper with your insurance), but the website can give you the price if you did not use any insurance.  - You can print the associated coupon and take it with your prescription to the pharmacy.  - You may also stop by our office during regular business hours and pick up a GoodRx coupon card.  - If you need your prescription sent electronically to a different pharmacy, notify our office through [facility] or by phone at [phone] option 4.     Si Usted Necesita Algo Despus de Su Visita  Tambin puede enviarnos un mensaje a travs de Clinical cytogeneticist. Por lo general respondemos a los mensajes de MyChart en el transcurso de 1 a 2 das hbiles.  Para renovar recetas, por favor pida a su farmacia que se  ponga en contacto con nuestra oficina. Annie Sable de fax es [place] [phone].  Si tiene un asunto urgente cuando la clnica est cerrada y que no puede esperar hasta el siguiente da hbil, puede llamar/localizar a su doctor(a) al nmero que aparece a continuacin.   Por favor, tenga en cuenta que aunque hacemos todo lo posible para estar disponibles para asuntos urgentes fuera del horario de [place], no estamos disponibles las 24 horas del da, los 7 [address]  [address] de la [place].   Si tiene un problema urgente y no puede comunicarse con nosotros, puede optar por buscar atencin mdica  en el consultorio de su doctor(a), en una clnica privada, en un centro de atencin urgente o en una sala de emergencias.  Si tiene Engineer, drilling, por favor llame inmediatamente al 911 o vaya a la sala de emergencias.  Nmeros de bper  - Dr. Gwen Pounds: [phone]  - Dra. Moye: [phone]  - Dra. Roseanne Reno: [phone]  En caso de inclemencias del [place], por favor llame a Lacy Duverney principal al [phone] para una actualizacin sobre el [place] de cualquier retraso o cierre.  Consejos para la medicacin en dermatologa: Por favor, guarde las cajas en las que vienen los medicamentos de uso tpico para ayudarle a seguir las instrucciones sobre dnde y cmo usarlos. Las farmacias generalmente imprimen las instrucciones del medicamento slo en las cajas y no directamente en los tubos del [place].   Si su medicamento es muy caro, por favor, pngase en contacto con Rolm [place] llamando al [phone] y presione la opcin 4 o envenos un mensaje a travs de Clinical cytogeneticist.   No podemos decirle cul ser su copago por los medicamentos por adelantado ya que esto es diferente dependiendo de la cobertura de su seguro. Sin embargo, es posible que podamos encontrar un medicamento sustituto a Audiological scientist un formulario para que el seguro cubra el medicamento que se considera necesario.   Si se requiere  una autorizacin previa para que su compaa de seguros [place] su medicamento, por favor permtanos de 1 a 2 das hbiles para completar [address]  Los precios de los medicamentos varan con frecuencia dependiendo del Environmental consultant de dnde se surte la receta y alguna farmacias pueden ofrecer precios ms baratos.  El sitio web www.goodrx.com tiene cupones para medicamentos de Health and safety inspector. Los precios aqu no tienen en cuenta lo que podra costar con la ayuda del seguro (puede ser ms barato con su seguro), pero el sitio web puede darle el precio si no utiliz Tourist information centre manager.  - Puede imprimir el cupn correspondiente y llevarlo con su receta a la farmacia.  - Tambin puede pasar por nuestra oficina durante el horario  de atencin regular y Charity fundraiser una tarjeta de cupones de GoodRx.  - Si necesita que su receta se enve electrnicamente a una farmacia diferente, informe a nuestra oficina a travs de MyChart de [place] o por telfono llamando al [phone] y presione la opcin 4.

## 2023-03-08 NOTE — Progress Notes (Signed)
Follow-Up Visit   Subjective  Anthony Welch. is a 77 y.o. male who presents for the following: Skin Cancer Screening and Full Body Skin Exam  The patient presents for Total-Body Skin Exam (TBSE) for skin cancer screening and mole check. The patient has spots, moles and lesions to be evaluated, some may be new or changing and the patient has concerns that these could be cancer.   The following portions of the chart were reviewed this encounter and updated as appropriate: medications, allergies, medical history  Review of Systems:  No other skin or systemic complaints except as noted in HPI or Assessment and Plan.  Objective  Well appearing patient in no apparent distress; mood and affect are within normal limits.  A full examination was performed including scalp, head, eyes, ears, nose, lips, neck, chest, axillae, abdomen, back, buttocks, bilateral upper extremities, bilateral lower extremities, hands, feet, fingers, toes, fingernails, and toenails. All findings within normal limits unless otherwise noted below.   Relevant physical exam findings are noted in the Assessment and Plan.    Assessment & Plan   LENTIGINES, SEBORRHEIC KERATOSES, HEMANGIOMAS - Benign normal skin lesions - Benign-appearing - Call for any changes  MELANOCYTIC NEVI - Tan-Manthey and/or pink-flesh-colored symmetric macules and papules - Benign appearing on exam today - Observation - Call clinic for new or changing moles - Recommend daily use of broad spectrum spf 30+ sunscreen to sun-exposed areas.   ACTINIC DAMAGE WITH PRECANCEROUS ACTINIC KERATOSES Counseling for Topical Chemotherapy Management: Patient exhibits: - Severe, confluent actinic changes with pre-cancerous actinic keratoses that is secondary to cumulative UV radiation exposure over time - Condition that is severe; chronic, not at goal. - diffuse scaly erythematous macules and papules with underlying dyspigmentation - Discussed  Prescription "Field Treatment" topical Chemotherapy for Severe, Chronic Confluent Actinic Changes with Pre-Cancerous Actinic Keratoses Field treatment involves treatment of an entire area of skin that has confluent Actinic Changes (Sun/ Ultraviolet light damage) and PreCancerous Actinic Keratoses by method of PhotoDynamic Therapy (PDT) and/or prescription Topical Chemotherapy agents such as 5-fluorouracil, 5-fluorouracil/calcipotriene, and/or imiquimod.  The purpose is to decrease the number of clinically evident and subclinical PreCancerous lesions to prevent progression to development of skin cancer by chemically destroying early precancer changes that may or may not be visible.  It has been shown to reduce the risk of developing skin cancer in the treated area. As a result of treatment, redness, scaling, crusting, and open sores may occur during treatment course. One or more than one of these methods may be used and may have to be used several times to control, suppress and eliminate the PreCancerous changes. Discussed treatment course, expected reaction, and possible side effects. - Recommend daily broad spectrum sunscreen SPF 30+ to sun-exposed areas, reapply every 2 hours as needed.  - Staying in the shade or wearing long sleeves, sun glasses (UVA+UVB protection) and wide brim hats (4-inch brim around the entire circumference of the hat) are also recommended. - Call for new or changing lesions. - In one month start 5FU cream BID x 7 days (pt has at home) to the scalp. May spot treat the nose and R hand if still scaly after one month.   ACTINIC KERATOSIS - Hypertrophic  Exam: Erythematous thin papules/macules with gritty scale  Actinic keratoses are precancerous spots that appear secondary to cumulative UV radiation exposure/sun exposure over time. They are chronic with expected duration over 1 year. A portion of actinic keratoses will progress to squamous cell carcinoma of  the skin. It is not  possible to reliably predict which spots will progress to skin cancer and so treatment is recommended to prevent development of skin cancer.  Recommend daily broad spectrum sunscreen SPF 30+ to sun-exposed areas, reapply every 2 hours as needed.  Recommend staying in the shade or wearing long sleeves, sun glasses (UVA+UVB protection) and wide brim hats (4-inch brim around the entire circumference of the hat). Call for new or changing lesions.  Treatment Plan:  Prior to procedure, discussed risks of blister formation, small wound, skin dyspigmentation, or rare scar following cryotherapy. Recommend Vaseline ointment to treated areas while healing.  Destruction Procedure Note Destruction method: cryotherapy   Informed consent: discussed and consent obtained   Lesion destroyed using liquid nitrogen: Yes   Outcome: patient tolerated procedure well with no complications   Post-procedure details: wound care instructions given   Locations: R dorsum hand x 1,scalp and face x 7 # of Lesions Treated: 8  SEBORRHEIC DERMATITIS Exam: Pink patches with greasy scale at chest  Chronic and persistent condition with duration or expected duration over one year. Condition is bothersome/symptomatic for patient. Currently flared.  Seborrheic Dermatitis is a chronic persistent rash characterized by pinkness and scaling most commonly of the mid face but also can occur on the scalp (dandruff), ears; mid chest, mid back and groin.  It tends to be exacerbated by stress and cooler weather.  People who have neurologic disease may experience new onset or exacerbation of existing seborrheic dermatitis.  The condition is not curable but treatable and can be controlled.  Treatment Plan: Continue Ketoconazole 2% cream to aa's QD on M,W,F and HC 2.5% cream QD on T, Th,Sat   SKIN CANCER SCREENING PERFORMED TODAY.  Return in about 6 months (around 09/07/2023) for AK follow up .  Anthony Welch, CMA, am acting as scribe  for Armida Sans, MD .  Documentation: I have reviewed the above documentation for accuracy and completeness, and I agree with the above.  Armida Sans, MD

## 2023-03-15 ENCOUNTER — Encounter: Payer: Self-pay | Admitting: Dermatology

## 2023-03-31 DIAGNOSIS — E349 Endocrine disorder, unspecified: Secondary | ICD-10-CM | POA: Diagnosis not present

## 2023-04-12 ENCOUNTER — Ambulatory Visit: Payer: PPO | Admitting: Dermatology

## 2023-04-22 DIAGNOSIS — Z0000 Encounter for general adult medical examination without abnormal findings: Secondary | ICD-10-CM | POA: Diagnosis not present

## 2023-04-22 DIAGNOSIS — E349 Endocrine disorder, unspecified: Secondary | ICD-10-CM | POA: Diagnosis not present

## 2023-04-22 DIAGNOSIS — F419 Anxiety disorder, unspecified: Secondary | ICD-10-CM | POA: Diagnosis not present

## 2023-04-22 DIAGNOSIS — Z125 Encounter for screening for malignant neoplasm of prostate: Secondary | ICD-10-CM | POA: Diagnosis not present

## 2023-05-12 DIAGNOSIS — E349 Endocrine disorder, unspecified: Secondary | ICD-10-CM | POA: Diagnosis not present

## 2023-05-12 DIAGNOSIS — H524 Presbyopia: Secondary | ICD-10-CM | POA: Diagnosis not present

## 2023-06-01 ENCOUNTER — Other Ambulatory Visit: Payer: Self-pay

## 2023-06-01 DIAGNOSIS — L219 Seborrheic dermatitis, unspecified: Secondary | ICD-10-CM

## 2023-06-01 MED ORDER — HYDROCORTISONE 2.5 % EX CREA
TOPICAL_CREAM | CUTANEOUS | 0 refills | Status: DC
Start: 2023-06-01 — End: 2024-04-03

## 2023-06-02 DIAGNOSIS — Z125 Encounter for screening for malignant neoplasm of prostate: Secondary | ICD-10-CM | POA: Diagnosis not present

## 2023-06-02 DIAGNOSIS — F419 Anxiety disorder, unspecified: Secondary | ICD-10-CM | POA: Diagnosis not present

## 2023-06-02 DIAGNOSIS — E349 Endocrine disorder, unspecified: Secondary | ICD-10-CM | POA: Diagnosis not present

## 2023-06-23 DIAGNOSIS — E349 Endocrine disorder, unspecified: Secondary | ICD-10-CM | POA: Diagnosis not present

## 2023-07-14 DIAGNOSIS — E349 Endocrine disorder, unspecified: Secondary | ICD-10-CM | POA: Diagnosis not present

## 2023-08-04 DIAGNOSIS — E349 Endocrine disorder, unspecified: Secondary | ICD-10-CM | POA: Diagnosis not present

## 2023-08-24 DIAGNOSIS — E349 Endocrine disorder, unspecified: Secondary | ICD-10-CM | POA: Diagnosis not present

## 2023-09-08 ENCOUNTER — Ambulatory Visit: Payer: PPO | Admitting: Dermatology

## 2023-09-08 DIAGNOSIS — M25561 Pain in right knee: Secondary | ICD-10-CM | POA: Diagnosis not present

## 2023-09-29 DIAGNOSIS — E349 Endocrine disorder, unspecified: Secondary | ICD-10-CM | POA: Diagnosis not present

## 2023-10-17 DIAGNOSIS — M25561 Pain in right knee: Secondary | ICD-10-CM | POA: Diagnosis not present

## 2023-10-17 DIAGNOSIS — M79605 Pain in left leg: Secondary | ICD-10-CM | POA: Diagnosis not present

## 2023-10-19 DIAGNOSIS — M25561 Pain in right knee: Secondary | ICD-10-CM | POA: Diagnosis not present

## 2023-10-19 DIAGNOSIS — M25562 Pain in left knee: Secondary | ICD-10-CM | POA: Diagnosis not present

## 2023-10-20 DIAGNOSIS — E349 Endocrine disorder, unspecified: Secondary | ICD-10-CM | POA: Diagnosis not present

## 2023-10-31 DIAGNOSIS — M25561 Pain in right knee: Secondary | ICD-10-CM | POA: Diagnosis not present

## 2023-10-31 DIAGNOSIS — M25562 Pain in left knee: Secondary | ICD-10-CM | POA: Diagnosis not present

## 2023-11-04 DIAGNOSIS — E349 Endocrine disorder, unspecified: Secondary | ICD-10-CM | POA: Diagnosis not present

## 2023-11-24 DIAGNOSIS — E349 Endocrine disorder, unspecified: Secondary | ICD-10-CM | POA: Diagnosis not present

## 2023-12-12 DIAGNOSIS — M1711 Unilateral primary osteoarthritis, right knee: Secondary | ICD-10-CM | POA: Diagnosis not present

## 2023-12-14 ENCOUNTER — Ambulatory Visit: Payer: PPO | Admitting: Dermatology

## 2023-12-15 DIAGNOSIS — E349 Endocrine disorder, unspecified: Secondary | ICD-10-CM | POA: Diagnosis not present

## 2024-01-02 DIAGNOSIS — M25561 Pain in right knee: Secondary | ICD-10-CM | POA: Diagnosis not present

## 2024-01-06 DIAGNOSIS — E349 Endocrine disorder, unspecified: Secondary | ICD-10-CM | POA: Diagnosis not present

## 2024-01-09 DIAGNOSIS — M25561 Pain in right knee: Secondary | ICD-10-CM | POA: Diagnosis not present

## 2024-01-19 ENCOUNTER — Ambulatory Visit: Payer: PPO | Admitting: Dermatology

## 2024-01-24 DIAGNOSIS — M25521 Pain in right elbow: Secondary | ICD-10-CM | POA: Diagnosis not present

## 2024-01-24 DIAGNOSIS — M1811 Unilateral primary osteoarthritis of first carpometacarpal joint, right hand: Secondary | ICD-10-CM | POA: Diagnosis not present

## 2024-01-24 DIAGNOSIS — M189 Osteoarthritis of first carpometacarpal joint, unspecified: Secondary | ICD-10-CM | POA: Diagnosis not present

## 2024-01-26 DIAGNOSIS — E349 Endocrine disorder, unspecified: Secondary | ICD-10-CM | POA: Diagnosis not present

## 2024-01-26 DIAGNOSIS — S83241A Other tear of medial meniscus, current injury, right knee, initial encounter: Secondary | ICD-10-CM | POA: Diagnosis not present

## 2024-01-28 DIAGNOSIS — M25521 Pain in right elbow: Secondary | ICD-10-CM | POA: Diagnosis not present

## 2024-02-01 DIAGNOSIS — M7021 Olecranon bursitis, right elbow: Secondary | ICD-10-CM | POA: Diagnosis not present

## 2024-02-01 DIAGNOSIS — M1811 Unilateral primary osteoarthritis of first carpometacarpal joint, right hand: Secondary | ICD-10-CM | POA: Diagnosis not present

## 2024-02-01 DIAGNOSIS — X58XXXA Exposure to other specified factors, initial encounter: Secondary | ICD-10-CM | POA: Diagnosis not present

## 2024-02-01 DIAGNOSIS — S46311A Strain of muscle, fascia and tendon of triceps, right arm, initial encounter: Secondary | ICD-10-CM | POA: Diagnosis not present

## 2024-02-01 DIAGNOSIS — Y999 Unspecified external cause status: Secondary | ICD-10-CM | POA: Diagnosis not present

## 2024-02-10 DIAGNOSIS — M25621 Stiffness of right elbow, not elsewhere classified: Secondary | ICD-10-CM | POA: Diagnosis not present

## 2024-02-14 DIAGNOSIS — S46311D Strain of muscle, fascia and tendon of triceps, right arm, subsequent encounter: Secondary | ICD-10-CM | POA: Diagnosis not present

## 2024-02-14 DIAGNOSIS — M25621 Stiffness of right elbow, not elsewhere classified: Secondary | ICD-10-CM | POA: Diagnosis not present

## 2024-02-15 ENCOUNTER — Ambulatory Visit: Admitting: Dermatology

## 2024-02-21 DIAGNOSIS — M25621 Stiffness of right elbow, not elsewhere classified: Secondary | ICD-10-CM | POA: Diagnosis not present

## 2024-02-29 DIAGNOSIS — M25621 Stiffness of right elbow, not elsewhere classified: Secondary | ICD-10-CM | POA: Diagnosis not present

## 2024-03-09 DIAGNOSIS — E349 Endocrine disorder, unspecified: Secondary | ICD-10-CM | POA: Diagnosis not present

## 2024-03-15 ENCOUNTER — Ambulatory Visit: Admitting: Dermatology

## 2024-03-15 DIAGNOSIS — M25621 Stiffness of right elbow, not elsewhere classified: Secondary | ICD-10-CM | POA: Diagnosis not present

## 2024-03-29 DIAGNOSIS — M25621 Stiffness of right elbow, not elsewhere classified: Secondary | ICD-10-CM | POA: Diagnosis not present

## 2024-04-03 ENCOUNTER — Other Ambulatory Visit: Payer: Self-pay | Admitting: Dermatology

## 2024-04-03 DIAGNOSIS — H6123 Impacted cerumen, bilateral: Secondary | ICD-10-CM | POA: Diagnosis not present

## 2024-04-03 DIAGNOSIS — L219 Seborrheic dermatitis, unspecified: Secondary | ICD-10-CM

## 2024-04-03 DIAGNOSIS — E349 Endocrine disorder, unspecified: Secondary | ICD-10-CM | POA: Diagnosis not present

## 2024-04-03 DIAGNOSIS — H902 Conductive hearing loss, unspecified: Secondary | ICD-10-CM | POA: Diagnosis not present

## 2024-04-05 DIAGNOSIS — M25621 Stiffness of right elbow, not elsewhere classified: Secondary | ICD-10-CM | POA: Diagnosis not present

## 2024-04-11 DIAGNOSIS — M94261 Chondromalacia, right knee: Secondary | ICD-10-CM | POA: Diagnosis not present

## 2024-04-11 DIAGNOSIS — X58XXXA Exposure to other specified factors, initial encounter: Secondary | ICD-10-CM | POA: Diagnosis not present

## 2024-04-11 DIAGNOSIS — S83231A Complex tear of medial meniscus, current injury, right knee, initial encounter: Secondary | ICD-10-CM | POA: Diagnosis not present

## 2024-04-11 DIAGNOSIS — S83261A Peripheral tear of lateral meniscus, current injury, right knee, initial encounter: Secondary | ICD-10-CM | POA: Diagnosis not present

## 2024-04-11 DIAGNOSIS — Y999 Unspecified external cause status: Secondary | ICD-10-CM | POA: Diagnosis not present

## 2024-04-11 DIAGNOSIS — S83282A Other tear of lateral meniscus, current injury, left knee, initial encounter: Secondary | ICD-10-CM | POA: Diagnosis not present

## 2024-04-11 DIAGNOSIS — G8918 Other acute postprocedural pain: Secondary | ICD-10-CM | POA: Diagnosis not present

## 2024-04-11 DIAGNOSIS — M65961 Unspecified synovitis and tenosynovitis, right lower leg: Secondary | ICD-10-CM | POA: Diagnosis not present

## 2024-04-17 DIAGNOSIS — H6121 Impacted cerumen, right ear: Secondary | ICD-10-CM | POA: Diagnosis not present

## 2024-04-17 DIAGNOSIS — H902 Conductive hearing loss, unspecified: Secondary | ICD-10-CM | POA: Diagnosis not present

## 2024-04-18 DIAGNOSIS — M25561 Pain in right knee: Secondary | ICD-10-CM | POA: Diagnosis not present

## 2024-04-26 DIAGNOSIS — E349 Endocrine disorder, unspecified: Secondary | ICD-10-CM | POA: Diagnosis not present

## 2024-04-27 DIAGNOSIS — M25561 Pain in right knee: Secondary | ICD-10-CM | POA: Diagnosis not present

## 2024-05-08 ENCOUNTER — Ambulatory Visit: Admitting: Dermatology

## 2024-05-14 DIAGNOSIS — M25561 Pain in right knee: Secondary | ICD-10-CM | POA: Diagnosis not present

## 2024-05-17 DIAGNOSIS — E349 Endocrine disorder, unspecified: Secondary | ICD-10-CM | POA: Diagnosis not present

## 2024-06-06 ENCOUNTER — Ambulatory Visit: Admitting: Dermatology

## 2024-06-06 ENCOUNTER — Encounter: Payer: Self-pay | Admitting: Dermatology

## 2024-06-06 DIAGNOSIS — L578 Other skin changes due to chronic exposure to nonionizing radiation: Secondary | ICD-10-CM | POA: Diagnosis not present

## 2024-06-06 DIAGNOSIS — L820 Inflamed seborrheic keratosis: Secondary | ICD-10-CM | POA: Diagnosis not present

## 2024-06-06 DIAGNOSIS — Z1283 Encounter for screening for malignant neoplasm of skin: Secondary | ICD-10-CM | POA: Diagnosis not present

## 2024-06-06 DIAGNOSIS — Z79899 Other long term (current) drug therapy: Secondary | ICD-10-CM

## 2024-06-06 DIAGNOSIS — Z5111 Encounter for antineoplastic chemotherapy: Secondary | ICD-10-CM

## 2024-06-06 DIAGNOSIS — L570 Actinic keratosis: Secondary | ICD-10-CM | POA: Diagnosis not present

## 2024-06-06 DIAGNOSIS — L219 Seborrheic dermatitis, unspecified: Secondary | ICD-10-CM

## 2024-06-06 DIAGNOSIS — L821 Other seborrheic keratosis: Secondary | ICD-10-CM | POA: Diagnosis not present

## 2024-06-06 DIAGNOSIS — Z7189 Other specified counseling: Secondary | ICD-10-CM

## 2024-06-06 DIAGNOSIS — W908XXA Exposure to other nonionizing radiation, initial encounter: Secondary | ICD-10-CM

## 2024-06-06 MED ORDER — FLUOROURACIL 5 % EX CREA
TOPICAL_CREAM | CUTANEOUS | 2 refills | Status: AC
Start: 2024-06-06 — End: ?

## 2024-06-06 MED ORDER — KETOCONAZOLE 2 % EX CREA: TOPICAL_CREAM | CUTANEOUS | 11 refills | Status: AC

## 2024-06-06 MED ORDER — HYDROCORTISONE 2.5 % EX CREA: TOPICAL_CREAM | CUTANEOUS | 11 refills | Status: AC

## 2024-06-06 NOTE — Progress Notes (Signed)
 Follow-Up Visit   Subjective  Anthony Welch. is a 78 y.o. male who presents for the following: AK recheck. Scalp. Hx of LN2 treatment and 5FU/Calcipotriene treatment in the past.  The patient presents for Upper Body Skin Exam (UBSE) for skin cancer screening and mole check.  The patient has spots, moles and lesions to be evaluated, some may be new or changing and the patient has concerns that these could be cancer.  Seborrheic dermatitis on chest. Flaring on face. Using Ketoconazole  and HC creams as directed. Needs refills.   The following portions of the chart were reviewed this encounter and updated as appropriate: medications, allergies, medical history  Review of Systems:  No other skin or systemic complaints except as noted in HPI or Assessment and Plan.  Objective  Well appearing patient in no apparent distress; mood and affect are within normal limits.  Waist up exam performed today. Relevant exam findings are noted in the Assessment and Plan.  Scalp x6 (6) Erythematous thin papules/macules with gritty scale.  Left Cheek x1, R medial pectoral x1 (2) Erythematous keratotic or waxy stuck-on papule or plaque.  Assessment & Plan   ACTINIC DAMAGE WITH PRECANCEROUS ACTINIC KERATOSES Counseling for Topical Chemotherapy Management: Patient exhibits: - Severe, confluent actinic changes with pre-cancerous actinic keratoses that is secondary to cumulative UV radiation exposure over time - Condition that is severe; chronic, not at goal. - diffuse scaly erythematous macules and papules with underlying dyspigmentation - Discussed Prescription Field Treatment topical Chemotherapy for Severe, Chronic Confluent Actinic Changes with Pre-Cancerous Actinic Keratoses Field treatment involves treatment of an entire area of skin that has confluent Actinic Changes (Sun/ Ultraviolet light damage) and PreCancerous Actinic Keratoses by method of PhotoDynamic Therapy (PDT) and/or prescription  Topical Chemotherapy agents such as 5-fluorouracil , 5-fluorouracil /calcipotriene, and/or imiquimod.  The purpose is to decrease the number of clinically evident and subclinical PreCancerous lesions to prevent progression to development of skin cancer by chemically destroying early precancer changes that may or may not be visible.  It has been shown to reduce the risk of developing skin cancer in the treated area. As a result of treatment, redness, scaling, crusting, and open sores may occur during treatment course. One or more than one of these methods may be used and may have to be used several times to control, suppress and eliminate the PreCancerous changes. Discussed treatment course, expected reaction, and possible side effects. - Recommend daily broad spectrum sunscreen SPF 30+ to sun-exposed areas, reapply every 2 hours as needed.  - Staying in the shade or wearing long sleeves, sun glasses (UVA+UVB protection) and wide brim hats (4-inch brim around the entire circumference of the hat) are also recommended. - Call for new or changing lesions. On September 1st- Start 5-fluorouracil /calcipotriene cream twice a day for 7 days to affected areas including scalp for precancerous lesions. Prescription sent to [facility]. Patient advised they will receive an email to purchase the medication online and have it sent to their home. Patient provided with handout reviewing treatment course and side effects and advised to call or message us  on MyChart with any concerns.  Reviewed course of treatment and expected reaction.  Patient advised to expect inflammation and crusting and advised that erosions are possible.  Patient advised to be diligent with sun protection during and after treatment. Counseled to keep medication out of reach of children and pets.   SEBORRHEIC DERMATITIS Exam: Pink patches with greasy scale at central face Chronic and persistent condition  with duration or expected  duration over one year. Condition is bothersome/symptomatic for patient. Currently flared. Seborrheic Dermatitis is a chronic persistent rash characterized by pinkness and scaling most commonly of the mid face but also can occur on the scalp (dandruff), ears; mid chest, mid back and groin.  It tends to be exacerbated by stress and cooler weather.  People who have neurologic disease may experience new onset or exacerbation of existing seborrheic dermatitis.  The condition is not curable but treatable and can be controlled. Treatment Plan: Hydrocortisone  2.5% cream Apply nightly three times a week (Tu., Thur, Sat.) to affected areas on face, groin and chest as needed for seborrheic dermatitis Ketoconazole  2% cream Apply once daily to affected areas on face, groin and chest for seborrheic dermatitis   SEBORRHEIC KERATOSIS - Stuck-on, waxy, tan-Owen papules and/or plaques  - Benign-appearing - Discussed benign etiology and prognosis. - Observe - Call for any changes  ACTINIC DAMAGE - chronic, secondary to cumulative UV radiation exposure/sun exposure over time - diffuse scaly erythematous macules with underlying dyspigmentation - Recommend daily broad spectrum sunscreen SPF 30+ to sun-exposed areas, reapply every 2 hours as needed.  - Recommend staying in the shade or wearing long sleeves, sun glasses (UVA+UVB protection) and wide brim hats (4-inch brim around the entire circumference of the hat). - Call for new or changing lesions.  Skin cancer Screening  AK (ACTINIC KERATOSIS) (6) Scalp x6 (6) Actinic keratoses are precancerous spots that appear secondary to cumulative UV radiation exposure/sun exposure over time. They are chronic with expected duration over 1 year. A portion of actinic keratoses will progress to squamous cell carcinoma of the skin. It is not possible to reliably predict which spots will progress to skin cancer and so treatment is recommended to prevent development of skin  cancer.  Recommend daily broad spectrum sunscreen SPF 30+ to sun-exposed areas, reapply every 2 hours as needed.  Recommend staying in the shade or wearing long sleeves, sun glasses (UVA+UVB protection) and wide brim hats (4-inch brim around the entire circumference of the hat). Call for new or changing lesions. Destruction of lesion - Scalp x6 (6) Complexity: simple   Destruction method: cryotherapy   Informed consent: discussed and consent obtained   Timeout:  patient name, date of birth, surgical site, and procedure verified Lesion destroyed using liquid nitrogen: Yes   Region frozen until ice ball extended beyond lesion: Yes   Outcome: patient tolerated procedure well with no complications   Post-procedure details: wound care instructions given   Additional details:  Prior to procedure, discussed risks of blister formation, small wound, skin dyspigmentation, or rare scar following cryotherapy. Recommend Vaseline ointment to treated areas while healing.   Related Medications fluorouracil  (EFUDEX ) 5 % cream Apply twice daily to scalp for 7 days for precancerous lesions SEBORRHEIC DERMATITIS   Related Medications hydrocortisone  2.5 % cream Apply nightly three times a week (Tu., Thur, Sat.) to affected areas on face, groin and chest as needed for seborrheic dermatitis INFLAMED SEBORRHEIC KERATOSIS (2) Left Cheek x1, R medial pectoral x1 (2) Symptomatic, irritating, patient would like treated. Destruction of lesion - Left Cheek x1, R medial pectoral x1 (2) Complexity: simple   Destruction method: cryotherapy   Informed consent: discussed and consent obtained   Timeout:  patient name, date of birth, surgical site, and procedure verified Lesion destroyed using liquid nitrogen: Yes   Region frozen until ice ball extended beyond lesion: Yes   Outcome: patient tolerated procedure well with no complications  Post-procedure details: wound care instructions given   Additional details:   Prior to procedure, discussed risks of blister formation, small wound, skin dyspigmentation, or rare scar following cryotherapy. Recommend Vaseline ointment to treated areas while healing.   ACTINIC SKIN DAMAGE   CHEMOTHERAPY MANAGEMENT, ENCOUNTER FOR   COUNSELING AND COORDINATION OF CARE   MEDICATION MANAGEMENT   SEBORRHEIC KERATOSIS    Return in about 1 year (around 06/06/2025) for AK Follow Up.  I, Jill Parcell, CMA, am acting as scribe for Alm Rhyme, MD.   Documentation: I have reviewed the above documentation for accuracy and completeness, and I agree with the above.  Alm Rhyme, MD

## 2024-06-06 NOTE — Patient Instructions (Addendum)
 Cryotherapy Aftercare  Wash gently with soap and water everyday.   Apply Vaseline Jelly daily until healed.    Seborrheic dermatitis on face, groin, chest:  Hydrocortisone  2.5% cream Apply nightly three times a week (Tu., Thur, Sat.) to affected areas on face, groin and chest as needed for seborrheic dermatitis  Ketoconazole  2% cream Apply once daily to affected areas on face, groin and chest for seborrheic dermatitis   Actinic keratoses:  On September 1st- Start 5-fluorouracil /calcipotriene cream twice a day for 7 days to affected areas including scalp for precancerous lesions. Prescription sent to [facility]. Patient advised they will receive an email to purchase the medication online and have it sent to their home. Patient provided with handout reviewing treatment course and side effects and advised to call or message us  on MyChart with any concerns.  Reviewed course of treatment and expected reaction.  Patient advised to expect inflammation and crusting and advised that erosions are possible.  Patient advised to be diligent with sun protection during and after treatment. Counseled to keep medication out of reach of children and pets.     Instructions for [facility]  One or more of your medications was sent to the [facility]. You will receive an email from them and can purchase the medicine through that link. It will then be mailed to your home at the address you confirmed. If for any reason you do not receive an email from them, please check your spam folder. If you still do not find the email, please let us  know. [facility] phone number is [phone].    Recommend daily broad spectrum sunscreen SPF 30+ to sun-exposed areas, reapply every 2 hours as needed. Call for new or changing lesions.  Staying in the shade or wearing long sleeves, sun glasses (UVA+UVB protection) and wide brim hats (4-inch brim  around the entire circumference of the hat) are also recommended for sun protection.     Due to recent changes in healthcare laws, you may see results of your pathology and/or laboratory studies on MyChart before the doctors have had a chance to review them. We understand that in some cases there may be results that are confusing or concerning to you. Please understand that not all results are received at the same time and often the doctors may need to interpret multiple results in order to provide you with the best plan of care or course of treatment. Therefore, we ask that you please give us  2 business days to thoroughly review all your results before contacting the office for clarification. Should we see a critical lab result, you will be contacted sooner.   If You Need Anything After Your Visit  If you have any questions or concerns for your doctor, please call our main line at [phone] and press option 4 to reach your doctor's medical assistant. If no one answers, please leave a voicemail as directed and we will return your call as soon as possible. Messages left after 4 pm will be answered the following business day.   You may also send us  a message via MyChart. We typically respond to MyChart messages within 1-2 business days.  For prescription refills, please ask your pharmacy to contact our office. Our fax number is [phone].  If you have an urgent issue when the clinic is closed that cannot wait until the next business day, you can page your doctor at the number below.    Please note  that while we do our best to be available for urgent issues outside of office hours, we are not available 24/7.   If you have an urgent issue and are unable to reach us , you may choose to seek medical care at your doctor's office, retail clinic, urgent care center, or emergency room.  If you have a medical emergency, please immediately call 911 or go to the emergency department.  Pager  Numbers  - Dr. Hester: [phone]  - Dr. Jackquline: [phone]  - Dr. Claudene: [phone]   In the event of inclement weather, please call our main line at [phone] for an update on the status of any delays or closures.  Dermatology Medication Tips: Please keep the boxes that topical medications come in in order to help keep track of the instructions about where and how to use these. Pharmacies typically print the medication instructions only on the boxes and not directly on the medication tubes.   If your medication is too expensive, please contact our office at [phone] option 4 or send us  a message through MyChart.   We are unable to tell what your co-pay for medications will be in advance as this is different depending on your insurance coverage. However, we may be able to find a substitute medication at lower cost or fill out paperwork to get insurance to cover a needed medication.   If a prior authorization is required to get your medication covered by your insurance company, please allow us  1-2 business days to complete this process.  Drug prices often vary depending on where the prescription is filled and some pharmacies may offer cheaper prices.  The website www.goodrx.com contains coupons for medications through different pharmacies. The prices here do not account for what the cost may be with help from insurance (it may be cheaper with your insurance), but the website can give you the price if you did not use any insurance.  - You can print the associated coupon and take it with your prescription to the pharmacy.  - You may also stop by our office during regular business hours and pick up a GoodRx coupon card.  - If you need your prescription sent electronically to a different pharmacy, notify our office through [facility] or by phone at [phone] option 4.     Si Usted Necesita Algo Despus de Su Visita  Tambin puede enviarnos un mensaje a travs de  Clinical cytogeneticist. Por lo general respondemos a los mensajes de MyChart en el transcurso de 1 a 2 das hbiles.  Para renovar recetas, por favor pida a su farmacia que se ponga en contacto con nuestra oficina. Randi lakes de fax es [place] [phone].  Si tiene un asunto urgente cuando la clnica est cerrada y que no puede esperar hasta el siguiente da hbil, puede llamar/localizar a su doctor(a) al nmero que aparece a continuacin.   Por favor, tenga en cuenta que aunque hacemos todo lo posible para estar disponibles para asuntos urgentes fuera del horario de [place], no estamos disponibles las 24 horas del da, los 7 [address]  [address] de la [place].   Si tiene un problema urgente y no puede comunicarse con nosotros, puede optar por buscar atencin mdica  en el consultorio de su doctor(a), en una clnica privada, en un centro de atencin urgente o en una sala de emergencias.  Si tiene Engineer, drilling, por favor llame inmediatamente al 911 o vaya a la sala de emergencias.  Nmeros de bper  - Dr. Hester: [phone]  -  Rance Rattler: [phone]  - Dr. Claudene: [phone]   En caso de inclemencias del tiempo, por favor llame a landry capes principal al [phone] para una actualizacin sobre el Council de cualquier retraso o cierre.  Consejos para la medicacin en dermatologa: Por favor, guarde las cajas en las que vienen los medicamentos de uso tpico para ayudarle a seguir las instrucciones sobre dnde y cmo usarlos. Las farmacias generalmente imprimen las instrucciones del medicamento slo en las cajas y no directamente en los tubos del [place].   Si su medicamento es muy caro, por favor, pngase en contacto con landry rieger llamando al [phone] y presione la opcin 4 o envenos un mensaje a travs de Clinical cytogeneticist.   No podemos decirle cul ser su copago por los medicamentos por adelantado ya que esto es diferente dependiendo de la cobertura de su seguro. Sin embargo, es posible que  podamos encontrar un medicamento sustituto a Audiological scientist un formulario para que el seguro cubra el medicamento que se considera necesario.   Si se requiere una autorizacin previa para que su compaa de seguros malta su medicamento, por favor permtanos de 1 a 2 das hbiles para completar este proceso.  Los precios de los medicamentos varan con frecuencia dependiendo del Environmental consultant de dnde se surte la receta y alguna farmacias pueden ofrecer precios ms baratos.  El sitio web www.goodrx.com tiene cupones para medicamentos de Health and safety inspector. Los precios aqu no tienen en cuenta lo que podra costar con la ayuda del seguro (puede ser ms barato con su seguro), pero el sitio web puede darle el precio si no utiliz Tourist information centre manager.  - Puede imprimir el cupn correspondiente y llevarlo con su receta a la farmacia.  - Tambin puede pasar por nuestra oficina durante el horario de atencin regular y Education officer, museum una tarjeta de cupones de GoodRx.  - Si necesita que su receta se enve electrnicamente a una farmacia diferente, informe a nuestra oficina a travs de MyChart de [place] o por telfono llamando al [phone] y presione la opcin 4.

## 2024-06-07 DIAGNOSIS — E349 Endocrine disorder, unspecified: Secondary | ICD-10-CM | POA: Diagnosis not present

## 2024-06-12 ENCOUNTER — Encounter: Payer: Self-pay | Admitting: Dermatology

## 2024-06-20 DIAGNOSIS — F419 Anxiety disorder, unspecified: Secondary | ICD-10-CM | POA: Diagnosis not present

## 2024-06-20 DIAGNOSIS — E349 Endocrine disorder, unspecified: Secondary | ICD-10-CM | POA: Diagnosis not present

## 2024-06-20 DIAGNOSIS — Z0000 Encounter for general adult medical examination without abnormal findings: Secondary | ICD-10-CM | POA: Diagnosis not present

## 2024-06-20 DIAGNOSIS — R350 Frequency of micturition: Secondary | ICD-10-CM | POA: Diagnosis not present

## 2024-06-20 DIAGNOSIS — Z1331 Encounter for screening for depression: Secondary | ICD-10-CM | POA: Diagnosis not present

## 2024-06-28 DIAGNOSIS — E349 Endocrine disorder, unspecified: Secondary | ICD-10-CM | POA: Diagnosis not present

## 2024-06-28 DIAGNOSIS — F419 Anxiety disorder, unspecified: Secondary | ICD-10-CM | POA: Diagnosis not present

## 2024-06-28 DIAGNOSIS — R350 Frequency of micturition: Secondary | ICD-10-CM | POA: Diagnosis not present

## 2024-07-12 DIAGNOSIS — E349 Endocrine disorder, unspecified: Secondary | ICD-10-CM | POA: Diagnosis not present

## 2024-07-24 ENCOUNTER — Ambulatory Visit: Payer: PPO | Admitting: Dermatology

## 2024-08-16 ENCOUNTER — Ambulatory Visit: Admitting: Dermatology

## 2024-08-16 DIAGNOSIS — E349 Endocrine disorder, unspecified: Secondary | ICD-10-CM | POA: Diagnosis not present

## 2024-08-23 DIAGNOSIS — N390 Urinary tract infection, site not specified: Secondary | ICD-10-CM | POA: Diagnosis not present

## 2024-08-23 DIAGNOSIS — U071 COVID-19: Secondary | ICD-10-CM | POA: Diagnosis not present

## 2024-08-23 DIAGNOSIS — R030 Elevated blood-pressure reading, without diagnosis of hypertension: Secondary | ICD-10-CM | POA: Diagnosis not present

## 2024-10-16 DIAGNOSIS — H900 Conductive hearing loss, bilateral: Secondary | ICD-10-CM | POA: Diagnosis not present

## 2024-10-16 DIAGNOSIS — H6123 Impacted cerumen, bilateral: Secondary | ICD-10-CM | POA: Diagnosis not present

## 2024-10-18 DIAGNOSIS — E349 Endocrine disorder, unspecified: Secondary | ICD-10-CM | POA: Diagnosis not present

## 2024-10-23 DIAGNOSIS — H6121 Impacted cerumen, right ear: Secondary | ICD-10-CM | POA: Diagnosis not present

## 2024-10-23 DIAGNOSIS — H900 Conductive hearing loss, bilateral: Secondary | ICD-10-CM | POA: Diagnosis not present

## 2025-03-19 ENCOUNTER — Ambulatory Visit: Payer: PPO | Admitting: Dermatology

## 2025-06-11 ENCOUNTER — Ambulatory Visit: Admitting: Dermatology
